# Patient Record
Sex: Female | Born: 1990 | Hispanic: Yes | Marital: Single | Smoking: Current every day smoker
Health system: Southern US, Community
[De-identification: ages and names within clinical notes are randomized; demographics above are authoritative.]

## PROBLEM LIST (undated history)

## (undated) HISTORY — PX: EYE SURGERY: SHX253

---

## 2015-07-29 LAB — HM HIV SCREENING LAB: HM HIV Screening: NEGATIVE

## 2017-12-21 ENCOUNTER — Ambulatory Visit: Payer: Self-pay | Admitting: Family Medicine

## 2017-12-21 NOTE — Progress Notes (Deleted)
   Subjective:    Patient ID: Stacy Zuniga, female    DOB: 01/01/1991, 27 y.o.   MRN: [id number]  HPI  Presents to clinic to establish with PCP  Review of Systems  Constitutional: Negative for chills, fatigue and fever.  HENT: Negative for congestion, ear pain, sinus pain and sore throat.   Eyes: Negative.   Respiratory: Negative for cough, shortness of breath and wheezing.   Cardiovascular: Negative for chest pain, palpitations and leg swelling.  Gastrointestinal: Negative for abdominal pain, diarrhea, nausea and vomiting.  Genitourinary: Negative for dysuria, frequency and urgency.  Musculoskeletal: Negative for arthralgias and myalgias.  Skin: Negative for color change, pallor and rash.  Neurological: Negative for syncope, light-headedness and headaches.  Psychiatric/Behavioral: The patient is not nervous/anxious.       Objective:   Physical Exam        Assessment & Plan:

## 2018-02-07 ENCOUNTER — Ambulatory Visit: Payer: Self-pay | Admitting: Physician Assistant

## 2018-03-14 LAB — HM PAP SMEAR: HM Pap smear: NEGATIVE

## 2018-03-22 ENCOUNTER — Ambulatory Visit: Payer: BC Managed Care – PPO | Admitting: Physician Assistant

## 2018-03-22 ENCOUNTER — Other Ambulatory Visit: Payer: Self-pay

## 2018-03-22 ENCOUNTER — Encounter: Payer: Self-pay | Admitting: Physician Assistant

## 2018-03-22 VITALS — BP 104/63 | HR 64 | Temp 98.4°F | Resp 16 | Wt 122.4 lb

## 2018-03-22 DIAGNOSIS — Z3041 Encounter for surveillance of contraceptive pills: Secondary | ICD-10-CM

## 2018-03-22 DIAGNOSIS — J45909 Unspecified asthma, uncomplicated: Secondary | ICD-10-CM | POA: Diagnosis not present

## 2018-03-22 DIAGNOSIS — Z114 Encounter for screening for human immunodeficiency virus [HIV]: Secondary | ICD-10-CM | POA: Diagnosis not present

## 2018-03-22 DIAGNOSIS — Z1329 Encounter for screening for other suspected endocrine disorder: Secondary | ICD-10-CM | POA: Diagnosis not present

## 2018-03-22 DIAGNOSIS — Z13220 Encounter for screening for lipoid disorders: Secondary | ICD-10-CM

## 2018-03-22 DIAGNOSIS — Z131 Encounter for screening for diabetes mellitus: Secondary | ICD-10-CM

## 2018-03-22 DIAGNOSIS — K121 Other forms of stomatitis: Secondary | ICD-10-CM

## 2018-03-22 DIAGNOSIS — Z130 Encounter for screening for diseases of the blood and blood-forming organs and certain disorders involving the immune mechanism: Secondary | ICD-10-CM

## 2018-03-22 MED ORDER — NORETHINDRONE ACET-ETHINYL EST 1-20 MG-MCG PO TABS: 1.0000 | ORAL_TABLET | Freq: Every day | ORAL | 3 refills | Status: DC

## 2018-03-22 MED ORDER — MAGIC MOUTHWASH W/LIDOCAINE: 5.0000 mL | Freq: Three times a day (TID) | ORAL | 0 refills | Status: DC

## 2018-03-22 MED ORDER — ALBUTEROL SULFATE HFA 108 (90 BASE) MCG/ACT IN AERS: 2.0000 | INHALATION_SPRAY | Freq: Four times a day (QID) | RESPIRATORY_TRACT | 2 refills | Status: DC | PRN

## 2018-03-22 NOTE — Patient Instructions (Signed)
Asthma, Adult    Asthma is a long-term (chronic) condition in which the airways get tight and narrow. The airways are the breathing passages that lead from the nose and mouth down into the lungs. A person with asthma will have times when symptoms get worse. These are called asthma attacks. They can cause coughing, whistling sounds when you breathe (wheezing), shortness of breath, and chest pain. They can make it hard to breathe. There is no cure for asthma, but medicines and lifestyle changes can help control it.  There are many things that can bring on an asthma attack or make asthma symptoms worse (triggers). Common triggers include:  · Mold.  · Dust.  · Cigarette smoke.  · Cockroaches.  · Things that can cause allergy symptoms (allergens). These include animal skin flakes (dander) and pollen from trees or grass.  · Things that pollute the air. These may include household cleaners, wood smoke, smog, or chemical odors.  · Cold air, weather changes, and wind.  · Crying or laughing hard.  · Stress.  · Certain medicines or drugs.  · Certain foods such as dried fruit, potato chips, and grape juice.  · Infections, such as a cold or the flu.  · Certain medical conditions or diseases.  · Exercise or tiring activities.  Asthma may be treated with medicines and by staying away from the things that cause asthma attacks. Types of medicines may include:  · Controller medicines. These help prevent asthma symptoms. They are usually taken every day.  · Fast-acting reliever or rescue medicines. These quickly relieve asthma symptoms. They are used as needed and provide short-term relief.  · Allergy medicines if your attacks are brought on by allergens.  · Medicines to help control the body's defense (immune) system.  Follow these instructions at home:  Avoiding triggers in your home  · Change your heating and air conditioning filter often.  · Limit your use of fireplaces and wood stoves.  · Get rid of pests (such as roaches and  mice) and their droppings.  · Throw away plants if you see mold on them.  · Clean your floors. Dust regularly. Use cleaning products that do not smell.  · Have someone vacuum when you are not home. Use a vacuum cleaner with a HEPA filter if possible.  · Replace carpet with wood, tile, or vinyl flooring. Carpet can trap animal skin flakes and dust.  · Use allergy-proof pillows, mattress covers, and box spring covers.  · Wash bed sheets and blankets every week in hot water. Dry them in a dryer.  · Keep your bedroom free of any triggers.   · Avoid pets and keep windows closed when things that cause allergy symptoms are in the air.  · Use blankets that are made of polyester or cotton.  · Clean bathrooms and kitchens with bleach. If possible, have someone repaint the walls in these rooms with mold-resistant paint. Keep out of the rooms that are being cleaned and painted.  · Wash your hands often with soap and water. If soap and water are not available, use hand sanitizer.  · Do not allow anyone to smoke in your home.  General instructions  · Take over-the-counter and prescription medicines only as told by your doctor.  ? Talk with your doctor if you have questions about how or when to take your medicines.  ? Make note if you need to use your medicines more often than usual.  · Do not use any products that   contain nicotine or tobacco, such as cigarettes and e-cigarettes. If you need help quitting, ask your doctor.  · Stay away from secondhand smoke.  · Avoid doing things outdoors when allergen counts are high and when air quality is low.  · Wear a ski mask when doing outdoor activities in the winter. The mask should cover your nose and mouth. Exercise indoors on cold days if you can.  · Warm up before you exercise. Take time to cool down after exercise.  · Use a peak flow meter as told by your doctor. A peak flow meter is a tool that measures how well the lungs are working.  · Keep track of the peak flow meter's readings.  Write them down.  · Follow your asthma action plan. This is a written plan for taking care of your asthma and treating your attacks.  · Make sure you get all the shots (vaccines) that your doctor recommends. Ask your doctor about a flu shot and a pneumonia shot.  · Keep all follow-up visits as told by your doctor. This is important.  Contact a doctor if:  · You have wheezing, shortness of breath, or a cough even while taking medicine to prevent attacks.  · The mucus you cough up (sputum) is thicker than usual.  · The mucus you cough up changes from clear or white to yellow, green, gray, or bloody.  · You have problems from the medicine you are taking, such as:  ? A rash.  ? Itching.  ? Swelling.  ? Trouble breathing.  · You need reliever medicines more than 2-3 times a week.  · Your peak flow reading is still at 50-79% of your personal best after following the action plan for 1 hour.  · You have a fever.  Get help right away if:  · You seem to be worse and are not responding to medicine during an asthma attack.  · You are short of breath even at rest.  · You get short of breath when doing very little activity.  · You have trouble eating, drinking, or talking.  · You have chest pain or tightness.  · You have a fast heartbeat.  · Your lips or fingernails start to turn blue.  · You are light-headed or dizzy, or you faint.  · Your peak flow is less than 50% of your personal best.  · You feel too tired to breathe normally.  Summary  · Asthma is a long-term (chronic) condition in which the airways get tight and narrow. An asthma attack can make it hard to breathe.  · Asthma cannot be cured, but medicines and lifestyle changes can help control it.  · Make sure you understand how to avoid triggers and how and when to use your medicines.  This information is not intended to replace advice given to you by your health care provider. Make sure you discuss any questions you have with your health care provider.  Document  Released: 06/08/2007 Document Revised: 01/25/2016 Document Reviewed: 01/25/2016  Elsevier Interactive Patient Education © 2019 Elsevier Inc.

## 2018-03-22 NOTE — Progress Notes (Signed)
Patient: Stacy Zuniga, Female    DOB: May 20, 1990, 28 y.o.   MRN: [id number] Visit Date: 03/22/2018  Today's Provider: Trey Sailors, PA-C   Chief Complaint  Patient presents with  . Establish Care   Subjective:     Establish Care: Stacy Zuniga is a 28 y.o. female who presents today to establish care.  Last saw [place] last November. Living in [place] with boyfriend, no children. Works at [employer]. Seen at [facility] clinic gynecology and PAP is up to date.   Patient reports an ulcer that has been on the lower inner portion of her lip since November. Says it has come and gone until November, it has lingered since that time. Report it is sensitive to spicy and citrus fruits. Reports she saw a dentist who didn't know what it was but thought it could be related to autoimmune disease.   She needs refills on her OCP, Junel 1/20. She denies history of blood clot, stroke, heart attack. She has quit smoking.   Last pap: 03/14/2018-Negative-in Care Everywhere Last Physical 03/09/2018 -----------------------------------------------------------------    Review of Systems  HENT: Positive for mouth sores, sinus pressure and tinnitus.   Gastrointestinal: Positive for constipation.  Allergic/Immunologic: Positive for environmental allergies.  Neurological: Positive for headaches.  All other systems reviewed and are negative.   Social History      She  reports that she has never smoked. She has never used smokeless tobacco. She reports current alcohol use. She reports current drug use. Drugs: Ketamine and Marijuana.       Social History   Socioeconomic History  . Marital status: Single    Spouse name: Not on file  . Number of children: Not on file  . Years of education: Not on file  . Highest education level: Not on file  Occupational History  . Not on file  Social Needs  . Financial resource strain: Not on file  . Food insecurity:    Worry: Not on file   Inability: Not on file  . Transportation needs:    Medical: Not on file    Non-medical: Not on file  Tobacco Use  . Smoking status: Never Smoker  . Smokeless tobacco: Never Used  Substance and Sexual Activity  . Alcohol use: Yes    Frequency: Never    Comment: 1 every 3 month  . Drug use: Yes    Types: Ketamine, Marijuana  . Sexual activity: Yes  Lifestyle  . Physical activity:    Days per week: Not on file    Minutes per session: Not on file  . Stress: Not on file  Relationships  . Social connections:    Talks on phone: Not on file    Gets together: Not on file    Attends religious service: Not on file    Active member of club or organization: Not on file    Attends meetings of clubs or organizations: Not on file    Relationship status: Not on file  Other Topics Concern  . Not on file  Social History Narrative  . Not on file    History reviewed. No pertinent past medical history.   There are no active problems to display for this patient.   History reviewed. No pertinent surgical history.  Family History        Family Status  Relation Name Status  . Mother  Alive  . Father  Alive        Her family  history is not on file.      No Known Allergies   Current Outpatient Medications:  .  ammonium lactate (LAC-HYDRIN) 12 % lotion, Apply topically., Disp: , Rfl:  .  fluticasone (FLONASE) 50 MCG/ACT nasal spray, Place into the nose., Disp: , Rfl:  .  omeprazole (PRILOSEC) 40 MG capsule, Take by mouth., Disp: , Rfl:  .  albuterol (PROVENTIL HFA;VENTOLIN HFA) 108 (90 Base) MCG/ACT inhaler, Inhale 2 puffs into the lungs every 6 (six) hours as needed for wheezing or shortness of breath., Disp: 1 Inhaler, Rfl: 2 .  magic mouthwash w/lidocaine SOLN, Take 5 mLs by mouth 3 (three) times daily., Disp: 120 mL, Rfl: 0 .  norethindrone-ethinyl estradiol (JUNEL 1/20) 1-20 MG-MCG tablet, Take 1 tablet by mouth daily., Disp: 3 Package, Rfl: 3   Patient Care Team: Maryella ShiversPollak,  Adriana M, PA-C as PCP - General (Physician Assistant)    Objective:    Vitals: BP 104/63 (BP Location: Left Arm, Patient Position: Sitting, Cuff Size: Normal)   Pulse 64   Temp 98.4 F (36.9 C) (Oral)   Resp 16   Wt 122 lb 6.4 oz (55.5 kg)   LMP 03/21/2018    Vitals:   03/22/18 1426  BP: 104/63  Pulse: 64  Resp: 16  Temp: 98.4 F (36.9 C)  TempSrc: Oral  Weight: 122 lb 6.4 oz (55.5 kg)     Physical Exam Constitutional:      Appearance: Normal appearance.  HENT:     Right Ear: Tympanic membrane and ear canal normal.     Left Ear: Tympanic membrane and ear canal normal.     Mouth/Throat:     Lips: Lesions present.   Cardiovascular:     Rate and Rhythm: Normal rate and regular rhythm.     Heart sounds: Normal heart sounds.  Pulmonary:     Effort: Pulmonary effort is normal.     Breath sounds: Normal breath sounds.  Skin:    General: Skin is warm and dry.  Neurological:     Mental Status: She is alert and oriented to person, place, and time. Mental status is at baseline.  Psychiatric:        Mood and Affect: Mood normal.        Behavior: Behavior normal.      Depression Screen PHQ 2/9 Scores 03/22/2018  PHQ - 2 Score 0       Assessment & Plan:     Routine Health Maintenance and Physical Exam  Exercise Activities and Dietary recommendations Goals   None      There is no immunization history on file for this patient.  Health Maintenance  Topic Date Due  . HIV Screening  06/02/2007  . TETANUS/TDAP  06/02/2011  . PAP-Cervical Cytology Screening  06/01/2013  . PAP SMEAR-Modifier  06/01/2013  . INFLUENZA VACCINE  08/03/2017     Discussed health benefits of physical activity, and encouraged her to engage in regular exercise appropriate for her age and condition.    1. Moderate asthma, unspecified whether complicated, unspecified whether persistent  - albuterol (PROVENTIL HFA;VENTOLIN HFA) 108 (90 Base) MCG/ACT inhaler; Inhale 2 puffs into the  lungs every 6 (six) hours as needed for wheezing or shortness of breath.  Dispense: 1 Inhaler; Refill: 2  2. Encounter for screening for HIV  - HIV antibody (with reflex)  3. Thyroid disorder screening  - TSH  4. Lipid screening  - Lipid Profile  5. Screening for deficiency anemia  - CBC with  Differential  6. Diabetes mellitus screening  - Comprehensive Metabolic Panel (CMET)  7. Mouth ulcer  Have given mouth wash as below. Because lesion is persistent, will have her see ENT.   - magic mouthwash w/lidocaine SOLN; Take 5 mLs by mouth 3 (three) times daily.  Dispense: 120 mL; Refill: 0 - Ambulatory referral to ENT  8. Encounter for surveillance of contraceptive pills  - norethindrone-ethinyl estradiol (JUNEL 1/20) 1-20 MG-MCG tablet; Take 1 tablet by mouth daily.  Dispense: 3 Package; Refill: 3  The entirety of the information documented in the History of Present Illness, Review of Systems and Physical Exam were personally obtained by me. Portions of this information were initially documented by Hetty Ely, CMA and reviewed by me for thoroughness and accuracy.      --------------------------------------------------------------------    Trey Sailors, PA-C  [facility]

## 2018-03-23 LAB — CBC WITH DIFFERENTIAL/PLATELET
Basophils Absolute: 0.1 10*3/uL (ref 0.0–0.2)
Basos: 1 %
EOS (ABSOLUTE): 0.1 10*3/uL (ref 0.0–0.4)
Eos: 1 %
Hematocrit: 40.2 % (ref 34.0–46.6)
Hemoglobin: 13.1 g/dL (ref 11.1–15.9)
Immature Grans (Abs): 0 10*3/uL (ref 0.0–0.1)
Immature Granulocytes: 0 %
Lymphocytes Absolute: 3.1 10*3/uL (ref 0.7–3.1)
Lymphs: 38 %
MCH: 29.6 pg (ref 26.6–33.0)
MCHC: 32.6 g/dL (ref 31.5–35.7)
MCV: 91 fL (ref 79–97)
Monocytes Absolute: 0.6 10*3/uL (ref 0.1–0.9)
Monocytes: 7 %
Neutrophils Absolute: 4.2 10*3/uL (ref 1.4–7.0)
Neutrophils: 53 %
Platelets: 343 10*3/uL (ref 150–450)
RBC: 4.42 x10E6/uL (ref 3.77–5.28)
RDW: 13.6 % (ref 11.7–15.4)
WBC: 8.1 10*3/uL (ref 3.4–10.8)

## 2018-03-23 LAB — LIPID PANEL
Chol/HDL Ratio: 3.6 ratio (ref 0.0–4.4)
Cholesterol, Total: 172 mg/dL (ref 100–199)
HDL: 48 mg/dL (ref >39)
LDL Calculated: 98 mg/dL (ref 0–99)
Triglycerides: 129 mg/dL (ref 0–149)
VLDL Cholesterol Cal: 26 mg/dL (ref 5–40)

## 2018-03-23 LAB — COMPREHENSIVE METABOLIC PANEL
ALT: 50 IU/L — ABNORMAL HIGH (ref 0–32)
AST: 39 IU/L (ref 0–40)
Albumin/Globulin Ratio: 1.6 (ref 1.2–2.2)
Albumin: 4.5 g/dL (ref 3.9–5.0)
Alkaline Phosphatase: 63 IU/L (ref 39–117)
BUN/Creatinine Ratio: 16 (ref 9–23)
BUN: 9 mg/dL (ref 6–20)
Bilirubin Total: 0.4 mg/dL (ref 0.0–1.2)
CO2: 22 mmol/L (ref 20–29)
Calcium: 9 mg/dL (ref 8.7–10.2)
Chloride: 102 mmol/L (ref 96–106)
Creatinine, Ser: 0.55 mg/dL — ABNORMAL LOW (ref 0.57–1.00)
GFR calc Af Amer: 151 mL/min/{1.73_m2} (ref >59)
GFR calc non Af Amer: 131 mL/min/{1.73_m2} (ref >59)
Globulin, Total: 2.9 g/dL (ref 1.5–4.5)
Glucose: 73 mg/dL (ref 65–99)
Potassium: 3.4 mmol/L — ABNORMAL LOW (ref 3.5–5.2)
Sodium: 142 mmol/L (ref 134–144)
Total Protein: 7.4 g/dL (ref 6.0–8.5)

## 2018-03-23 LAB — TSH: TSH: 0.673 u[IU]/mL (ref 0.450–4.500)

## 2018-03-23 LAB — HIV ANTIBODY (ROUTINE TESTING W REFLEX): HIV Screen 4th Generation wRfx: NONREACTIVE

## 2018-04-02 ENCOUNTER — Ambulatory Visit: Payer: BC Managed Care – PPO | Admitting: Physician Assistant

## 2018-04-02 ENCOUNTER — Encounter: Payer: Self-pay | Admitting: Physician Assistant

## 2018-04-02 ENCOUNTER — Other Ambulatory Visit: Payer: Self-pay

## 2018-04-02 VITALS — BP 116/79 | HR 96 | Temp 98.5°F | Wt 123.6 lb

## 2018-04-02 DIAGNOSIS — R2231 Localized swelling, mass and lump, right upper limb: Secondary | ICD-10-CM

## 2018-04-02 NOTE — Patient Instructions (Signed)
Lymphadenopathy    Lymphadenopathy means that your lymph glands are swollen or larger than normal (enlarged). Lymph glands, also called lymph nodes, are collections of tissue that filter bacteria, viruses, and waste from your bloodstream. They are part of your body's disease-fighting system (immune system), which protects your body from germs.  There may be different causes of lymphadenopathy, depending on where it is in your body. Some types go away on their own. Lymphadenopathy can occur anywhere that you have lymph glands, including these areas:  · Neck (cervical lymphadenopathy).  · Chest (mediastinal lymphadenopathy).  · Lungs (hilar lymphadenopathy).  · Underarms (axillary lymphadenopathy).  · Groin (inguinal lymphadenopathy).  When your immune system responds to germs, infection-fighting cells and fluid build up in your lymph glands. This causes some swelling and enlargement. If the lymph glands do not go back to normal after you have an infection or disease, your health care provider may do tests. These tests help to monitor your condition and find the reason why the glands are still swollen and enlarged.  Follow these instructions at home:  · Get plenty of rest.  · Take over-the-counter and prescription medicines only as told by your health care provider. Your health care provider may recommend over-the-counter medicines for pain.  · If directed, apply heat to swollen lymph glands as often as told by your health care provider. Use the heat source that your health care provider recommends, such as a moist heat pack or a heating pad.  ? Place a towel between your skin and the heat source.  ? Leave the heat on for 20-30 minutes.  ? Remove the heat if your skin turns bright red. This is especially important if you are unable to feel pain, heat, or cold. You may have a greater risk of getting burned.  · Check your affected lymph glands every day for changes. Check other lymph gland areas as told by your health  care provider. Check for changes such as:  ? More swelling.  ? Sudden increase in size.  ? Redness or pain.  ? Hardness.  · Keep all follow-up visits as told by your health care provider. This is important.  Contact a health care provider if you have:  · Swelling that gets worse or spreads to other areas.  · Problems with breathing.  · Lymph glands that:  ? Are still swollen after 2 weeks.  ? Have suddenly gotten bigger.  ? Are red, painful, or hard.  · A fever or chills.  · Fatigue.  · A sore throat.  · Pain in your abdomen.  · Weight loss.  · Night sweats.  Get help right away if you have:  · Fluid leaking from an enlarged lymph gland.  · Severe pain.  · Chest pain.  · Shortness of breath.  Summary  · Lymphadenopathy means that your lymph glands are swollen or larger than normal (enlarged).  · Lymph glands (also called lymph nodes) are collections of tissue that filter bacteria, viruses, and waste from the bloodstream. They are part of your body's disease-fighting system (immune system).  · Lymphadenopathy can occur anywhere that you have lymph glands.  · If your enlarged and swollen lymph glands do not go back to normal after you have an infection or disease, your health care provider may do tests to monitor your condition and find the reason why the glands are still swollen and enlarged.  · Check your affected lymph glands every day for changes. Check other lymph   gland areas as told by your health care provider.  This information is not intended to replace advice given to you by your health care provider. Make sure you discuss any questions you have with your health care provider.  Document Released: 09/29/2007 Document Revised: 11/04/2016 Document Reviewed: 11/04/2016  Elsevier Interactive Patient Education © 2019 Elsevier Inc.

## 2018-04-02 NOTE — Progress Notes (Signed)
Patient: Stacy Zuniga Female    DOB: Jun 04, 1990   28 y.o.   MRN: [id number] Visit Date: 04/02/2018  Today's Provider: Trey Sailors, PA-C   Chief Complaint  Patient presents with  . Skin Problem   Subjective:    HPI  Skin Problem Patient presents today for a bump under right arm for about 3 weeks now. Patient states that the area is red and painful, but not warm to touch. Patient hasve not applied anything to the area. Patient states hse is taking Tylenol which helps with pain but pain returns after Tylenol wears off. She denies fevers, chills, nausea, vomiting, unintended weight loss. She denies breast notable breast mass. She denies rashes or wound on her chest or arms. She denies drainage from the mass.   Wt Readings from Last 3 Encounters:  04/02/18 123 lb 9.6 oz (56.1 kg)  03/22/18 122 lb 6.4 oz (55.5 kg)     No Known Allergies   Current Outpatient Medications:  .  albuterol (PROVENTIL HFA;VENTOLIN HFA) 108 (90 Base) MCG/ACT inhaler, Inhale 2 puffs into the lungs every 6 (six) hours as needed for wheezing or shortness of breath., Disp: 1 Inhaler, Rfl: 2 .  ammonium lactate (LAC-HYDRIN) 12 % lotion, Apply topically., Disp: , Rfl:  .  fluticasone (FLONASE) 50 MCG/ACT nasal spray, Place into the nose., Disp: , Rfl:  .  magic mouthwash w/lidocaine SOLN, Take 5 mLs by mouth 3 (three) times daily., Disp: 120 mL, Rfl: 0 .  norethindrone-ethinyl estradiol (JUNEL 1/20) 1-20 MG-MCG tablet, Take 1 tablet by mouth daily., Disp: 3 Package, Rfl: 3 .  omeprazole (PRILOSEC) 40 MG capsule, Take by mouth., Disp: , Rfl:   Review of Systems  HENT: Negative.   Respiratory: Negative.   Genitourinary: Negative.   Neurological: Negative.     Social History   Tobacco Use  . Smoking status: Never Smoker  . Smokeless tobacco: Never Used  Substance Use Topics  . Alcohol use: Yes    Frequency: Never    Comment: 1 every 3 month      Objective:   BP 116/79 (BP Location: Left  Arm, Patient Position: Sitting, Cuff Size: Normal)   Pulse 96   Temp 98.5 F (36.9 C) (Oral)   Wt 123 lb 9.6 oz (56.1 kg)   LMP 03/21/2018  Vitals:   04/02/18 1451  BP: 116/79  Pulse: 96  Temp: 98.5 F (36.9 C)  TempSrc: Oral  Weight: 123 lb 9.6 oz (56.1 kg)     Physical Exam Constitutional:      Appearance: Normal appearance.  Neck:     Musculoskeletal: No muscular tenderness.  Chest:     Breasts:        Right: Normal. No mass.        Left: Normal. No mass.    Lymphadenopathy:     Cervical: No cervical adenopathy.  Skin:    General: Skin is warm and dry.     Findings: No erythema or rash.  Neurological:     Mental Status: She is alert and oriented to person, place, and time. Mental status is at baseline.         Assessment & Plan    1. Arm mass, right  Palpable tender mass in right axilla, possibly lymphadenopathy vs. Cyst. No noted breast mass on exam, CBC and CMET from 03/22/2018 were normal, HIV negative. Counseled patient on various causes including infection, rash, breast mass, cyst etc. Patient would be comforted  by ultrasound to better characterize, will proceed as below. Advised patient to take 600 mg ibuprofen TID with food for 1-2 weeks to see if there is any improvement.   - [place] AXILLA RIGHT; Future  The entirety of the information documented in the History of Present Illness, Review of Systems and Physical Exam were personally obtained by me. Portions of this information were initially documented by [facility] and reviewed by me for thoroughness and accuracy.         Trey Sailors, PA-C  [facility]

## 2018-04-05 ENCOUNTER — Telehealth: Payer: Self-pay | Admitting: Physician Assistant

## 2018-04-05 DIAGNOSIS — R2231 Localized swelling, mass and lump, right upper limb: Secondary | ICD-10-CM

## 2018-04-05 NOTE — Telephone Encounter (Signed)
Per [facility] order for ultrasound will need to be changed to R breast ultrasound to include axilla [id number]

## 2018-04-05 NOTE — Telephone Encounter (Signed)
Please review

## 2018-04-05 NOTE — Telephone Encounter (Signed)
Ordered new ultrasound.

## 2018-04-11 ENCOUNTER — Inpatient Hospital Stay: Admission: RE | Admit: 2018-04-11 | Payer: Self-pay | Source: Ambulatory Visit

## 2018-04-30 ENCOUNTER — Ambulatory Visit
Admission: RE | Admit: 2018-04-30 | Discharge: 2018-04-30 | Disposition: A | Discharge status: Home / Self Care | Payer: BC Managed Care – PPO | Source: Ambulatory Visit | Attending: Physician Assistant | Admitting: Physician Assistant

## 2018-04-30 ENCOUNTER — Other Ambulatory Visit: Payer: Self-pay

## 2018-04-30 DIAGNOSIS — R2231 Localized swelling, mass and lump, right upper limb: Secondary | ICD-10-CM

## 2018-05-01 ENCOUNTER — Telehealth: Payer: Self-pay

## 2018-05-01 NOTE — Telephone Encounter (Signed)
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 This shouldn't cause issues. Recommended follow up ultrasound in 6, 12, and 24 months.

## 2018-05-01 NOTE — Telephone Encounter (Signed)
Patient was advised.  

## 2018-08-14 ENCOUNTER — Ambulatory Visit: Payer: BC Managed Care – PPO | Admitting: Physician Assistant

## 2018-08-17 ENCOUNTER — Ambulatory Visit: Payer: BC Managed Care – PPO | Admitting: Physician Assistant

## 2018-08-24 ENCOUNTER — Emergency Department: Payer: Self-pay

## 2018-08-24 ENCOUNTER — Emergency Department
Admission: EM | Admit: 2018-08-24 | Discharge: 2018-08-25 | Disposition: A | Discharge status: Home / Self Care | Payer: Self-pay | Attending: Emergency Medicine | Admitting: Emergency Medicine

## 2018-08-24 ENCOUNTER — Other Ambulatory Visit: Payer: Self-pay

## 2018-08-24 DIAGNOSIS — Z79899 Other long term (current) drug therapy: Secondary | ICD-10-CM | POA: Insufficient documentation

## 2018-08-24 DIAGNOSIS — R079 Chest pain, unspecified: Secondary | ICD-10-CM

## 2018-08-24 DIAGNOSIS — R109 Unspecified abdominal pain: Secondary | ICD-10-CM | POA: Insufficient documentation

## 2018-08-24 DIAGNOSIS — R0789 Other chest pain: Secondary | ICD-10-CM | POA: Insufficient documentation

## 2018-08-24 DIAGNOSIS — R05 Cough: Secondary | ICD-10-CM | POA: Insufficient documentation

## 2018-08-24 DIAGNOSIS — F17200 Nicotine dependence, unspecified, uncomplicated: Secondary | ICD-10-CM | POA: Insufficient documentation

## 2018-08-24 LAB — CBC
HCT: 42.2 % (ref 36.0–46.0)
Hemoglobin: 14.3 g/dL (ref 12.0–15.0)
MCH: 29.5 pg (ref 26.0–34.0)
MCHC: 33.9 g/dL (ref 30.0–36.0)
MCV: 87.2 fL (ref 80.0–100.0)
Platelets: 299 10*3/uL (ref 150–400)
RBC: 4.84 MIL/uL (ref 3.87–5.11)
RDW: 12.9 % (ref 11.5–15.5)
WBC: 9.7 10*3/uL (ref 4.0–10.5)
nRBC: 0 % (ref 0.0–0.2)

## 2018-08-24 LAB — HEPATIC FUNCTION PANEL
ALT: 32 U/L (ref 0–44)
AST: 28 U/L (ref 15–41)
Albumin: 4.4 g/dL (ref 3.5–5.0)
Alkaline Phosphatase: 53 U/L (ref 38–126)
Bilirubin, Direct: 0.1 mg/dL (ref 0.0–0.2)
Indirect Bilirubin: 0.6 mg/dL (ref 0.3–0.9)
Total Bilirubin: 0.7 mg/dL (ref 0.3–1.2)
Total Protein: 7.7 g/dL (ref 6.5–8.1)

## 2018-08-24 LAB — BASIC METABOLIC PANEL
Anion gap: 11 (ref 5–15)
BUN: 9 mg/dL (ref 6–20)
CO2: 24 mmol/L (ref 22–32)
Calcium: 9.2 mg/dL (ref 8.9–10.3)
Chloride: 103 mmol/L (ref 98–111)
Creatinine, Ser: 0.44 mg/dL (ref 0.44–1.00)
GFR calc Af Amer: >60 mL/min (ref >60)
GFR calc non Af Amer: >60 mL/min (ref >60)
Glucose, Bld: 99 mg/dL (ref 70–99)
Potassium: 3 mmol/L — ABNORMAL LOW (ref 3.5–5.1)
Sodium: 138 mmol/L (ref 135–145)

## 2018-08-24 LAB — TROPONIN I (HIGH SENSITIVITY)
Troponin I (High Sensitivity): <2 ng/L (ref <18)
Troponin I (High Sensitivity): <2 ng/L (ref <18)

## 2018-08-24 LAB — POCT PREGNANCY, URINE: Preg Test, Ur: NEGATIVE

## 2018-08-24 LAB — LIPASE, BLOOD: Lipase: 28 U/L (ref 11–51)

## 2018-08-24 MED ORDER — IOHEXOL 350 MG/ML SOLN
75.0000 mL | Freq: Once | INTRAVENOUS | Status: AC | PRN
  Administered 2018-08-24: 75 mL via INTRAVENOUS

## 2018-08-24 MED ORDER — IOHEXOL 240 MG/ML SOLN
50.0000 mL | Freq: Once | INTRAMUSCULAR | Status: AC
  Administered 2018-08-24: 50 mL via ORAL

## 2018-08-24 NOTE — ED Notes (Signed)
Report given to Silvia, RN

## 2018-08-24 NOTE — ED Notes (Signed)
Pt laying in bed speaking with this RN in NAD, a&ox4. PT reports burping relieves her chest pain for a few mins but then it returns.

## 2018-08-24 NOTE — ED Notes (Signed)
Pt ambulatory to bedside commode no distress noted

## 2018-08-24 NOTE — ED Provider Notes (Signed)
[facility] Emergency Department Provider Note   ____________________________________________   First MD Initiated Contact with Patient 08/24/18 2138     (approximate)  I have reviewed the triage vital signs and the nursing notes.   HISTORY  Chief Complaint Chest Pain    HPI Stacy Zuniga is a 28 y.o. female who comes in complaining of pain in the left upper chest and upper chest radiating toward the right shoulder.  This been going on for about 3 days.  Before was intermittent is constant today.  She has a bit of a cough that is not too unusual for her because she has some allergies.  The pain in the chest can be reproduced with palpation but not in the shoulder.  Patient also reports left mid abdominal pain for 3 weeks.  Seems to be worse with food at times.  No nausea vomiting or diarrhea.  No fever.  Cough is nonproductive.  Again the cough is not a particularly new symptom.  Abdominal pain is not currently present was present earlier.  It is not severe discounted deep and achy.  Thing seems to make it better or worse particularly.         History reviewed. No pertinent past medical history.  There are no active problems to display for this patient.   History reviewed. No pertinent surgical history.  Prior to Admission medications   Medication Sig Start Date End Date Taking? Authorizing Provider  albuterol (PROVENTIL HFA;VENTOLIN HFA) 108 (90 Base) MCG/ACT inhaler Inhale 2 puffs into the lungs every 6 (six) hours as needed for wheezing or shortness of breath. 03/22/18   Trinna Post, PA-C  ammonium lactate (LAC-HYDRIN) 12 % lotion Apply topically.    [provider]  fluticasone (FLONASE) 50 MCG/ACT nasal spray Place into the nose. 02/09/16   [provider]  magic mouthwash w/lidocaine SOLN Take 5 mLs by mouth 3 (three) times daily. 03/22/18   Trinna Post, PA-C  norethindrone-ethinyl estradiol (JUNEL 1/20) 1-20 MG-MCG  tablet Take 1 tablet by mouth daily. 03/22/18   Trinna Post, PA-C  omeprazole (PRILOSEC) 40 MG capsule Take by mouth. 02/09/16   [provider]    Allergies Patient has no known allergies.  History reviewed. No pertinent family history.  Social History Social History   Tobacco Use   Smoking status: Current Every Day Smoker   Smokeless tobacco: Never Used  Substance Use Topics   Alcohol use: Yes    Frequency: Never    Comment: 1 every 3 month   Drug use: Yes    Types: Ketamine, Marijuana    Review of Systems  Constitutional: No fever/chills Eyes: No visual changes. ENT: No sore throat. Cardiovascular:  chest pain. Respiratory: Denies shortness of breath. Gastrointestinal:  abdominal pain.  No nausea, no vomiting.  No diarrhea.  No constipation. Genitourinary: Negative for dysuria. Musculoskeletal: Negative for back pain. Skin: Negative for rash. Neurological: Negative for headaches, focal weakness   ____________________________________________   PHYSICAL EXAM:  VITAL SIGNS: ED Triage Vitals  Enc Vitals Group     BP 08/24/18 1801 127/77     Pulse Rate 08/24/18 1801 95     Resp 08/24/18 1801 18     Temp 08/24/18 1801 98.7 F (37.1 C)     Temp Source 08/24/18 1801 Oral     SpO2 08/24/18 1801 100 %     Weight 08/24/18 1802 124 lb (56.2 kg)     Height 08/24/18 1802 4\' 11"  (  1.499 m)     Head Circumference --      Peak Flow --      Pain Score 08/24/18 1802 4     Pain Loc --      Pain Edu? --      Excl. in GC? --     Constitutional: Alert and oriented. Well appearing and in no acute distress. Eyes: Conjunctivae are normal. Head: Atraumatic. Nose: No congestion/rhinnorhea. Mouth/Throat: Mucous membranes are moist.  Oropharynx non-erythematous. Neck: No stridor.   Cardiovascular: Normal rate, regular rhythm. Grossly normal heart sounds.  Good peripheral circulation. Respiratory: Normal respiratory effort.  No retractions. Lungs  CTAB. Gastrointestinal: Soft and nontender. No distention. No abdominal bruits. No CVA tenderness. Musculoskeletal: No lower extremity tenderness nor edema.  Neurologic:  Normal speech and language. No gross focal neurologic deficits are appreciated.  Skin:  Skin is warm, dry and intact. No rash noted.  ____________________________________________   LABS (all labs ordered are listed, but only abnormal results are displayed)  Labs Reviewed  BASIC METABOLIC PANEL - Abnormal; Notable for the following components:      Result Value   Potassium 3.0 (*)    All other components within normal limits  CBC  LIPASE, BLOOD  HEPATIC FUNCTION PANEL  POC URINE PREG, ED  POCT PREGNANCY, URINE  TROPONIN I (HIGH SENSITIVITY)  TROPONIN I (HIGH SENSITIVITY)   ____________________________________________  EKG  EKG read interpreted by me shows sinus tachycardia rate of 110 normal axis slight ST segment depression only in AV half. ____________________________________________  RADIOLOGY  ED MD interpretation: Dust x-ray read by radiology as a pneumatocele or emphysematous change in the right midlung.  Recommend a nonacute CT scan.  Because of the patient's 3 weeks of abdominal pain on the chest pain today I will go ahead and CT her now.  Official radiology report(s): Dg Chest 2 View  Result Date: 08/24/2018 CLINICAL DATA:  Chest pain, cough EXAM: CHEST - 2 VIEW COMPARISON:  None. FINDINGS: The heart size and mediastinal contours are within normal limits. No acute airspace abnormality. There appears to be a pneumatocele or emphysematous change of the right midlung. The visualized skeletal structures are unremarkable. IMPRESSION: 1.  No acute abnormality of the lungs. 2. 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 This may be further evaluated by a nonacute CT examination of the chest in the setting of ongoing cough.  Electronically Signed   By: Lauralyn PrimesAlex  Bibbey M.D.   On: 08/24/2018 18:58    ____________________________________________   PROCEDURES  Procedure(s) performed (including Critical Care):  Procedures   ____________________________________________   INITIAL IMPRESSION / ASSESSMENT AND PLAN / ED COURSE Lujuana Brymer was evaluated in Emergency Department on 08/24/2018 for the symptoms described in the history of present illness. She was evaluated in the context of the global COVID-19 pandemic, which necessitated consideration that the patient might be at risk for infection with the SARS-CoV-2 virus that causes COVID-19. Institutional protocols and algorithms that pertain to the evaluation of patients at risk for COVID-19 are in a state of rapid change based on information released by regulatory bodies including the CDC and federal and state organizations. These policies and algorithms were followed during the patient's care in the ED.      CT scans pending signed out the oncoming physician.       ____________________________________________   FINAL CLINICAL IMPRESSION(S) / ED DIAGNOSES  Final diagnoses:  Nonspecific chest pain  Abdominal pain, unspecified abdominal location     ED Discharge Orders    None       Note:  This document was prepared using Dragon voice recognition software and may include unintentional dictation errors.    Arnaldo NatalMalinda, Terryann Verbeek F, MD 08/24/18 [phone]

## 2018-08-24 NOTE — ED Triage Notes (Signed)
Pt arrives via ems from home with c/o CP starting 3 days ago. First two days pain intermittent with constant pain today. Pt reports cough but states she has allergies that commonly cause cough. Pt reports chest pain in center chest that can be recreated with palpation, but also reports pain upper right back. NAD noted at this time. Pt denies any N/V/D or fevers

## 2018-08-25 MED ORDER — DICYCLOMINE HCL 20 MG PO TABS: 20.0000 mg | ORAL_TABLET | Freq: Three times a day (TID) | ORAL | 0 refills | Status: DC | PRN

## 2018-08-25 NOTE — ED Provider Notes (Signed)
-----------------------------------------   12:34 AM on 08/25/2018 -----------------------------------------  Patient's work-up is largely benign.  CT scan negative for acute abnormality.  Does show a borderline enlarged axillary lymph node.  Patient states she had already been evaluated for this previously.  Does state intermittent abdominal cramping for the past 3 weeks, states at one point she was diagnosed with IBS but a second doctor told her probably was not IBS so she is not sure.  With a negative work-up including negative CT I believe the patient safe for discharge home with PCP follow-up.  We will discharge with a short course of Bentyl to see if this aids in the patient's symptoms.  Patient agreeable to plan of care.   Harvest Dark, MD 08/25/18 [phone]

## 2018-08-25 NOTE — ED Notes (Signed)
Pt verbalizes understanding of discharge instructions.

## 2018-09-04 ENCOUNTER — Encounter: Payer: Self-pay | Admitting: Physician Assistant

## 2018-09-04 ENCOUNTER — Other Ambulatory Visit: Payer: Self-pay

## 2018-09-04 ENCOUNTER — Ambulatory Visit (INDEPENDENT_AMBULATORY_CARE_PROVIDER_SITE_OTHER): Payer: Self-pay | Admitting: Physician Assistant

## 2018-09-04 VITALS — BP 118/76 | HR 90 | Temp 97.8°F | Resp 16 | Wt 120.0 lb

## 2018-09-04 DIAGNOSIS — J029 Acute pharyngitis, unspecified: Secondary | ICD-10-CM

## 2018-09-04 DIAGNOSIS — R109 Unspecified abdominal pain: Secondary | ICD-10-CM

## 2018-09-04 DIAGNOSIS — J351 Hypertrophy of tonsils: Secondary | ICD-10-CM

## 2018-09-04 MED ORDER — DICYCLOMINE HCL 20 MG PO TABS: 20.0000 mg | ORAL_TABLET | Freq: Three times a day (TID) | ORAL | 2 refills | Status: DC

## 2018-09-04 NOTE — Patient Instructions (Signed)
Abdominal Pain, Adult    Many things can cause belly (abdominal) pain. Most times, belly pain is not dangerous. Many cases of belly pain can be watched and treated at home. Sometimes belly pain is serious, though. Your doctor will try to find the cause of your belly pain.  Follow these instructions at home:  · Take over-the-counter and prescription medicines only as told by your doctor. Do not take medicines that help you poop (laxatives) unless told to by your doctor.  · Drink enough fluid to keep your pee (urine) clear or pale yellow.  · Watch your belly pain for any changes.  · Keep all follow-up visits as told by your doctor. This is important.  Contact a doctor if:  · Your belly pain changes or gets worse.  · You are not hungry, or you lose weight without trying.  · You are having trouble pooping (constipated) or have watery poop (diarrhea) for more than 2-3 days.  · You have pain when you pee or poop.  · Your belly pain wakes you up at night.  · Your pain gets worse with meals, after eating, or with certain foods.  · You are throwing up and cannot keep anything down.  · You have a fever.  Get help right away if:  · Your pain does not go away as soon as your doctor says it should.  · You cannot stop throwing up.  · Your pain is only in areas of your belly, such as the right side or the left lower part of the belly.  · You have bloody or black poop, or poop that looks like tar.  · You have very bad pain, cramping, or bloating in your belly.  · You have signs of not having enough fluid or water in your body (dehydration), such as:  ? Dark pee, very little pee, or no pee.  ? Cracked lips.  ? Dry mouth.  ? Sunken eyes.  ? Sleepiness.  ? Weakness.  This information is not intended to replace advice given to you by your health care provider. Make sure you discuss any questions you have with your health care provider.  Document Released: 06/08/2007 Document Revised: 07/10/2015 Document Reviewed: 06/03/2015  Elsevier  Interactive Patient Education © 2020 [employer] Inc[employer]

## 2018-09-04 NOTE — Progress Notes (Signed)
Patient: Stacy Zuniga Female    DOB: 06-17-1990   28 y.o.   MRN: [id number] Visit Date: 09/04/2018  Today's Provider: Trey SailorsAdriana M Ange Puskas, PA-C   Chief Complaint  Patient presents with  . Follow-up   Subjective:     HPI  Follow up ER visit  Patient was seen in ER for chest pain on 08/24/2018. She was treated for GERD. Treatment for this included labs, x-ray and ct scan. Started on Bentyl She reports excellent compliance with treatment. She reports this condition is Improved.  Abdominal pain in LLQ that comes and goes. Would hurt if she laid down and hunched together. Reports nothing made it better. Nothing made it better completely, sometimes certain positions could make it better somewhat. She feels if she is more active, then it is better. If she is less active it seems more noticeable.  Has taken tylenol, unsure if it helped. No diarrhea, constipation. No heavy periods and not a lot of cramping. CT abdomen and pelvis did not show any infection, ovarian cysts. She feels the Bentyl has helped somewhat though she has been taking this sparingly due to receiving limited supply from ER. She was having some constipation initially before this episode that resolved with stool softeners.   Sore scratchy throat. This has started in the past week and gotten somewhat better. She does have post nasal drip. She has a history of large tonsils and recurrent sore throats, especially in the winter time. She would like to get her tonsils removed.   Not taking birth control. She is not currently sexually active. She does not desire children at this point. She knows that if she is sexually active she should use condoms and if she chooses to restart birth control, she should use condoms as back up protection for one month. ------------------------------------------------------------------------------------   No Known Allergies   Current Outpatient Medications:  .  albuterol (PROVENTIL  HFA;VENTOLIN HFA) 108 (90 Base) MCG/ACT inhaler, Inhale 2 puffs into the lungs every 6 (six) hours as needed for wheezing or shortness of breath., Disp: 1 Inhaler, Rfl: 2 .  ammonium lactate (LAC-HYDRIN) 12 % lotion, Apply topically., Disp: , Rfl:  .  dicyclomine (BENTYL) 20 MG tablet, Take 1 tablet (20 mg total) by mouth 3 (three) times daily as needed for spasms., Disp: 30 tablet, Rfl: 0 .  fluticasone (FLONASE) 50 MCG/ACT nasal spray, Place into the nose., Disp: , Rfl:  .  magic mouthwash w/lidocaine SOLN, Take 5 mLs by mouth 3 (three) times daily., Disp: 120 mL, Rfl: 0 .  omeprazole (PRILOSEC) 40 MG capsule, Take by mouth., Disp: , Rfl:  .  norethindrone-ethinyl estradiol (JUNEL 1/20) 1-20 MG-MCG tablet, Take 1 tablet by mouth daily. (Patient not taking: Reported on 09/04/2018), Disp: 3 Package, Rfl: 3  Review of Systems  Constitutional: Negative.   Gastrointestinal: Positive for abdominal distention, abdominal pain and nausea. Negative for diarrhea and vomiting.    Social History   Tobacco Use  . Smoking status: Former Smoker    Types: Cigarettes    Start date: 08/30/2018  . Smokeless tobacco: Never Used  Substance Use Topics  . Alcohol use: Yes    Frequency: Never    Comment: 1 every 3 month      Objective:   BP 118/76 (BP Location: Left Arm, Patient Position: Sitting, Cuff Size: Normal)   Pulse 90   Temp 97.8 F (36.6 C) (Temporal)   Resp 16   Wt 120 lb (54.4 kg)  BMI 24.24 kg/m  Vitals:   09/04/18 1327  BP: 118/76  Pulse: 90  Resp: 16  Temp: 97.8 F (36.6 C)  TempSrc: Temporal  Weight: 120 lb (54.4 kg)  Body mass index is 24.24 kg/m.   Physical Exam HENT:     Mouth/Throat:     Mouth: Mucous membranes are moist.     Pharynx: Oropharynx is clear. No oropharyngeal exudate or posterior oropharyngeal erythema.     Tonsils: No tonsillar exudate.     Comments: Tonsils enlarged bilaterally.  Cardiovascular:     Heart sounds: Normal heart sounds.  Pulmonary:      Effort: Pulmonary effort is normal.     Breath sounds: Normal breath sounds.  Abdominal:     General: Abdomen is flat. Bowel sounds are normal.     Palpations: Abdomen is soft.  Lymphadenopathy:     Cervical: No cervical adenopathy.  Skin:    General: Skin is warm and dry.  Neurological:     Mental Status: She is oriented to person, place, and time. Mental status is at baseline.  Psychiatric:        Mood and Affect: Mood normal.        Behavior: Behavior normal.      No results found for any visits on 09/04/18.     Assessment & Plan    1. Abdominal pain, unspecified abdominal location  Unclear etiology though infections and masses have been r/o with imaging and labwork in the ER. She has been told she possibly had IBS in the past and does have abdominal pain that is relieved by bowel movements and change in bowel consistency. Recommend she continue bentyl and miralax PRN to avoid constipation.   - dicyclomine (BENTYL) 20 MG tablet; Take 1 tablet (20 mg total) by mouth 4 (four) times daily -  before meals and at bedtime.  Dispense: 120 tablet; Refill: 2  2. Enlarged tonsils  - Ambulatory referral to ENT  3. Sore throat  Consistent with allergies. May take daily 2nd gen antihistamine of her choice.   - Ambulatory referral to ENT      Trinna Post, PA-C  [facility]

## 2018-09-24 ENCOUNTER — Other Ambulatory Visit: Payer: Self-pay

## 2018-09-24 ENCOUNTER — Encounter: Payer: Self-pay | Admitting: Physician Assistant

## 2018-09-24 ENCOUNTER — Telehealth (INDEPENDENT_AMBULATORY_CARE_PROVIDER_SITE_OTHER): Payer: Self-pay | Admitting: Physician Assistant

## 2018-09-24 ENCOUNTER — Other Ambulatory Visit: Payer: Self-pay | Admitting: Physician Assistant

## 2018-09-24 VITALS — Temp 98.6°F | Wt 116.0 lb

## 2018-09-24 DIAGNOSIS — K140 Glossitis: Secondary | ICD-10-CM

## 2018-09-24 DIAGNOSIS — N6310 Unspecified lump in the right breast, unspecified quadrant: Secondary | ICD-10-CM

## 2018-09-24 NOTE — Progress Notes (Signed)
Patient: Stacy Zuniga [place] Female    DOB: Mar 13, 1990   28 y.o.   MRN: [id number] Visit Date: 09/24/2018  Today's Provider: Trey SailorsAdriana M Fronia Depass, PA-C   No chief complaint on file.  Subjective:    Virtual Visit via Video Note  I connected with Cledith Dragos on 09/24/18 at  3:40 PM EDT by a video enabled telemedicine application and verified that I am speaking with the correct person using two identifiers.   I discussed the limitations of evaluation and management by telemedicine and the availability of in person appointments. The patient expressed understanding and agreed to proceed.  Patient location: home Provider location: [facility]/home office  Persons involved in the visit: patient, provider   Interactive audio and video communications were attempted, although failed due to patient's inability to connect to video. Continued visit with audio only interaction with patient agreement.    Otalgia  There is pain in both (Right worse than left) ears. Associated symptoms include rhinorrhea and a sore throat. Pertinent negatives include no ear discharge.  Sore Throat  This is a new problem. The current episode started in the past 7 days. Associated symptoms include ear pain and trouble swallowing. Pertinent negatives include no ear discharge.   Describes bumps on her tongue in the back of her tongue that have swollen up. Describes pain as 4/10. Has been using saltwater gargle, tylenol. Used old rx for magic mouthwash which seemed to irritate lesions. No blisters. No fever.   No Known Allergies   Current Outpatient Medications:  .  albuterol (PROVENTIL HFA;VENTOLIN HFA) 108 (90 Base) MCG/ACT inhaler, Inhale 2 puffs into the lungs every 6 (six) hours as needed for wheezing or shortness of breath., Disp: 1 Inhaler, Rfl: 2 .  ammonium lactate (LAC-HYDRIN) 12 % lotion, Apply topically., Disp: , Rfl:  .  dicyclomine (BENTYL) 20 MG tablet, Take 1 tablet (20 mg total) by mouth  4 (four) times daily -  before meals and at bedtime., Disp: 120 tablet, Rfl: 2 .  fluticasone (FLONASE) 50 MCG/ACT nasal spray, Place into the nose., Disp: , Rfl:  .  omeprazole (PRILOSEC) 40 MG capsule, Take by mouth., Disp: , Rfl:  .  magic mouthwash w/lidocaine SOLN, Take 5 mLs by mouth 3 (three) times daily. (Patient not taking: Reported on 09/24/2018), Disp: 120 mL, Rfl: 0  Review of Systems  Constitutional: Negative for activity change, appetite change, chills, diaphoresis, fatigue, fever and unexpected weight change.  HENT: Positive for ear pain, mouth sores, rhinorrhea, sinus pressure, sore throat and trouble swallowing. Negative for ear discharge, postnasal drip, sinus pain, sneezing, tinnitus and voice change.   Respiratory: Negative.   Gastrointestinal: Negative.     Social History   Tobacco Use  . Smoking status: Former Smoker    Types: Cigarettes    Start date: 08/30/2018  . Smokeless tobacco: Never Used  Substance Use Topics  . Alcohol use: Yes    Frequency: Never    Comment: 1 every 3 month      Objective:   Temp 98.6 F (37 C) (Temporal)   Wt 116 lb (52.6 kg)   BMI 23.43 kg/m  Vitals:   09/24/18 1355  Temp: 98.6 F (37 C)  TempSrc: Temporal  Weight: 116 lb (52.6 kg)  Body mass index is 23.43 kg/m.   Physical Exam   No results found for any visits on 09/24/18.     Assessment & Plan    1. Transient lingual papillitis  Video  did not work for visit but it sounds like patient is describing papillae. Does not sound like HSV. Counseled on likely self limiting nature. May use NSAIDs + tylenol for pain relief. Gargle three times daily with liquid benadryl for analgesic effect. May also try chlorseptic spray.   The entirety of the information documented in the History of Present Illness, Review of Systems and Physical Exam were personally obtained by me. Portions of this information were initially documented by Ashley Royalty, CMA and reviewed by me for  thoroughness and accuracy.        Trinna Post, PA-C  [facility]

## 2018-11-01 ENCOUNTER — Ambulatory Visit
Admission: RE | Admit: 2018-11-01 | Discharge: 2018-11-01 | Disposition: A | Discharge status: Home / Self Care | Payer: Self-pay | Source: Ambulatory Visit | Attending: Physician Assistant | Admitting: Physician Assistant

## 2018-11-01 DIAGNOSIS — N6310 Unspecified lump in the right breast, unspecified quadrant: Secondary | ICD-10-CM

## 2019-03-29 ENCOUNTER — Encounter: Payer: Self-pay | Admitting: Physician Assistant

## 2019-03-29 ENCOUNTER — Other Ambulatory Visit: Payer: Self-pay

## 2019-03-29 ENCOUNTER — Ambulatory Visit (INDEPENDENT_AMBULATORY_CARE_PROVIDER_SITE_OTHER): Payer: 59 | Admitting: Physician Assistant

## 2019-03-29 VITALS — BP 106/64 | HR 80 | Temp 97.1°F | Wt 119.8 lb

## 2019-03-29 DIAGNOSIS — E876 Hypokalemia: Secondary | ICD-10-CM | POA: Diagnosis not present

## 2019-03-29 DIAGNOSIS — Z3041 Encounter for surveillance of contraceptive pills: Secondary | ICD-10-CM | POA: Diagnosis not present

## 2019-03-29 DIAGNOSIS — M7540 Impingement syndrome of unspecified shoulder: Secondary | ICD-10-CM

## 2019-03-29 MED ORDER — MELOXICAM 15 MG PO TABS: 15.0000 mg | ORAL_TABLET | Freq: Every day | ORAL | 0 refills | Status: DC

## 2019-03-29 MED ORDER — NORETHIN ACE-ETH ESTRAD-FE 1-20 MG-MCG PO TABS: 1.0000 | ORAL_TABLET | Freq: Every day | ORAL | 3 refills | Status: DC

## 2019-03-29 NOTE — Patient Instructions (Signed)

## 2019-03-29 NOTE — Progress Notes (Signed)
Patient: Stacy Zuniga Female    DOB: May 12, 1990   29 y.o.   MRN: [id number] Visit Date: 03/29/2019  Today's Provider: Trey Sailors, PA-C   Chief Complaint  Patient presents with  . Shoulder Pain   Subjective:     Shoulder Pain  The pain is present in the right shoulder and left shoulder. This is a new problem. The current episode started in the past 7 days. The problem occurs intermittently. The problem has been gradually worsening. The quality of the pain is described as sharp and aching. The pain is at a severity of 4/10. The pain is mild. Associated symptoms include an inability to bear weight, joint swelling and a limited range of motion. Pertinent negatives include no joint locking, numbness, stiffness or tingling. The symptoms are aggravated by activity and lying down. She has tried cold, OTC ointments and NSAIDS for the symptoms. The treatment provided no relief.   Reports she recently just started working again and jumped at [employer] over the weekend. A few days ago she pulled her arm in a certain way that created pulling sensation and a lot of pain. She has used ibuprofen. 2-3 tablets per day which helped moderately. She reports her pain is worse at night. Fell on shoulder as a child. Right shoulder is hurting worse, she is right hand dominant. She is working at [employer] and lifts heavy items. Reaching up lifting a heavy item makes the pain worse. More recently heavy lifting more than normal.   Patient needing refill on birth control. She is on Junel 1-20 mg daily without issue. She denies history of heart attack, blood clot and stroke.   She also has a history of hypokalemia on last labwork.   CMP Latest Ref Rng & Units 08/24/2018 03/22/2018  Glucose 70 - 99 mg/dL 99 73  BUN 6 - 20 mg/dL 9 9  Creatinine 9.67 - 1.00 mg/dL 8.93 8.10(F)  Sodium 751 - 145 mmol/L 138 142  Potassium 3.5 - 5.1 mmol/L 3.0(L) 3.4(L)  Chloride 98 - 111 mmol/L 103 102  CO2 22 - 32  mmol/L 24 22  Calcium 8.9 - 10.3 mg/dL 9.2 9.0  Total Protein 6.5 - 8.1 g/dL 7.7 7.4  Total Bilirubin 0.3 - 1.2 mg/dL 0.7 0.4  Alkaline Phos 38 - 126 U/L 53 63  AST 15 - 41 U/L 28 39  ALT 0 - 44 U/L 32 50(H)    No Known Allergies   Current Outpatient Medications:  .  albuterol (PROVENTIL HFA;VENTOLIN HFA) 108 (90 Base) MCG/ACT inhaler, Inhale 2 puffs into the lungs every 6 (six) hours as needed for wheezing or shortness of breath., Disp: 1 Inhaler, Rfl: 2 .  ammonium lactate (LAC-HYDRIN) 12 % lotion, Apply topically., Disp: , Rfl:  .  dicyclomine (BENTYL) 20 MG tablet, Take 1 tablet (20 mg total) by mouth 4 (four) times daily -  before meals and at bedtime., Disp: 120 tablet, Rfl: 2 .  fluticasone (FLONASE) 50 MCG/ACT nasal spray, Place into the nose., Disp: , Rfl:  .  omeprazole (PRILOSEC) 40 MG capsule, Take by mouth., Disp: , Rfl:  .  magic mouthwash w/lidocaine SOLN, Take 5 mLs by mouth 3 (three) times daily. (Patient not taking: Reported on 09/24/2018), Disp: 120 mL, Rfl: 0 .  meloxicam (MOBIC) 15 MG tablet, Take 1 tablet (15 mg total) by mouth daily., Disp: 30 tablet, Rfl: 0  Review of Systems  Musculoskeletal: Negative for stiffness.  Neurological:  Negative for tingling and numbness.    Social History   Tobacco Use  . Smoking status: Former Smoker    Types: Cigarettes    Start date: 08/30/2018  . Smokeless tobacco: Never Used  Substance Use Topics  . Alcohol use: Yes    Comment: 1 every 3 month      Objective:   BP 106/64 (BP Location: Right Arm, Patient Position: Sitting, Cuff Size: Small)   Pulse 80   Temp (!) 97.1 F (36.2 C) (Temporal)   Wt 119 lb 12.8 oz (54.3 kg)   LMP 03/02/2018 (Exact Date)   BMI 24.20 kg/m  Vitals:   03/29/19 1356  BP: 106/64  Pulse: 80  Temp: (!) 97.1 F (36.2 C)  TempSrc: Temporal  Weight: 119 lb 12.8 oz (54.3 kg)  Body mass index is 24.2 kg/m.   Physical Exam Constitutional:      Appearance: Normal appearance.    Cardiovascular:     Rate and Rhythm: Normal rate.  Pulmonary:     Effort: Pulmonary effort is normal.  Musculoskeletal:     Comments: Some pain bilaterally with belly press and lift off test. Some pain with abduction bilaterally past 90 degrees.   Skin:    General: Skin is warm and dry.  Neurological:     Mental Status: She is alert.  Psychiatric:        Mood and Affect: Mood normal.        Behavior: Behavior normal.      No results found for any visits on 03/29/19.     Assessment & Plan    1. Impingement syndrome of shoulder region, unspecified laterality  - meloxicam (MOBIC) 15 MG tablet; Take 1 tablet (15 mg total) by mouth daily.  Dispense: 30 tablet; Refill: 0  2. Encounter for surveillance of contraceptive pills  - norethindrone-ethinyl estradiol (JUNEL FE 1/20) 1-20 MG-MCG tablet; Take 1 tablet by mouth daily.  Dispense: 3 Package; Refill: 3  3. Hypokalemia  Encouraged her to eat potassium rich foods. Will recheck again today.   - Comprehensive Metabolic Panel (CMET)  The entirety of the information documented in the History of Present Illness, Review of Systems and Physical Exam were personally obtained by me. Portions of this information were initially documented by [facility] and reviewed by me for thoroughness and accuracy.      Trinna Post, PA-C  [facility]

## 2019-03-30 LAB — COMPREHENSIVE METABOLIC PANEL
ALT: 28 IU/L (ref 0–32)
AST: 27 IU/L (ref 0–40)
Albumin/Globulin Ratio: 1.4 (ref 1.2–2.2)
Albumin: 4.3 g/dL (ref 3.9–5.0)
Alkaline Phosphatase: 61 IU/L (ref 39–117)
BUN/Creatinine Ratio: 13 (ref 9–23)
BUN: 7 mg/dL (ref 6–20)
Bilirubin Total: 0.5 mg/dL (ref 0.0–1.2)
CO2: 22 mmol/L (ref 20–29)
Calcium: 8.8 mg/dL (ref 8.7–10.2)
Chloride: 108 mmol/L — ABNORMAL HIGH (ref 96–106)
Creatinine, Ser: 0.53 mg/dL — ABNORMAL LOW (ref 0.57–1.00)
GFR calc Af Amer: 152 mL/min/{1.73_m2} (ref >59)
GFR calc non Af Amer: 131 mL/min/{1.73_m2} (ref >59)
Globulin, Total: 3 g/dL (ref 1.5–4.5)
Glucose: 66 mg/dL (ref 65–99)
Potassium: 4.1 mmol/L (ref 3.5–5.2)
Sodium: 143 mmol/L (ref 134–144)
Total Protein: 7.3 g/dL (ref 6.0–8.5)

## 2019-05-03 ENCOUNTER — Other Ambulatory Visit: Payer: Self-pay | Admitting: Physician Assistant

## 2019-05-03 DIAGNOSIS — M7540 Impingement syndrome of unspecified shoulder: Secondary | ICD-10-CM

## 2019-05-31 ENCOUNTER — Telehealth (INDEPENDENT_AMBULATORY_CARE_PROVIDER_SITE_OTHER): Payer: 59 | Admitting: Physician Assistant

## 2019-05-31 DIAGNOSIS — R0981 Nasal congestion: Secondary | ICD-10-CM

## 2019-05-31 DIAGNOSIS — R05 Cough: Secondary | ICD-10-CM

## 2019-05-31 DIAGNOSIS — Z20822 Contact with and (suspected) exposure to covid-19: Secondary | ICD-10-CM

## 2019-05-31 DIAGNOSIS — R062 Wheezing: Secondary | ICD-10-CM | POA: Diagnosis not present

## 2019-05-31 DIAGNOSIS — R059 Cough, unspecified: Secondary | ICD-10-CM

## 2019-05-31 MED ORDER — ALBUTEROL SULFATE HFA 108 (90 BASE) MCG/ACT IN AERS: 2.0000 | INHALATION_SPRAY | Freq: Four times a day (QID) | RESPIRATORY_TRACT | 0 refills | Status: DC | PRN

## 2019-05-31 MED ORDER — BENZONATATE 100 MG PO CAPS: 100.0000 mg | ORAL_CAPSULE | Freq: Three times a day (TID) | ORAL | 0 refills | Status: AC | PRN

## 2019-05-31 NOTE — Patient Instructions (Signed)
COVID-19 COVID-19 is a respiratory infection that is caused by a virus called severe acute respiratory syndrome coronavirus 2 (SARS-CoV-2). The disease is also known as coronavirus disease or novel coronavirus. In some people, the virus may not cause any symptoms. In others, it may cause a serious infection. The infection can get worse quickly and can lead to complications, such as:  Pneumonia, or infection of the lungs.  Acute respiratory distress syndrome or ARDS. This is a condition in which fluid build-up in the lungs prevents the lungs from filling with air and passing oxygen into the blood.  Acute respiratory failure. This is a condition in which there is not enough oxygen passing from the lungs to the body or when carbon dioxide is not passing from the lungs out of the body.  Sepsis or septic shock. This is a serious bodily reaction to an infection.  Blood clotting problems.  Secondary infections due to bacteria or fungus.  Organ failure. This is when your body's organs stop working. The virus that causes COVID-19 is contagious. This means that it can spread from person to person through droplets from coughs and sneezes (respiratory secretions). What are the causes? This illness is caused by a virus. You may catch the virus by:  Breathing in droplets from an infected person. Droplets can be spread by a person breathing, speaking, singing, coughing, or sneezing.  Touching something, like a table or a doorknob, that was exposed to the virus (contaminated) and then touching your mouth, nose, or eyes. What increases the risk? Risk for infection You are more likely to be infected with this virus if you:  Are within 6 feet (2 meters) of a person with COVID-19.  Provide care for or live with a person who is infected with COVID-19.  Spend time in crowded indoor spaces or live in shared housing. Risk for serious illness You are more likely to become seriously ill from the virus if you:   Are 50 years of age or older. The higher your age, the more you are at risk for serious illness.  Live in a nursing home or long-term care facility.  Have cancer.  Have a long-term (chronic) disease such as: ? Chronic lung disease, including chronic obstructive pulmonary disease or asthma. ? A long-term disease that lowers your body's ability to fight infection (immunocompromised). ? Heart disease, including heart failure, a condition in which the arteries that lead to the heart become narrow or blocked (coronary artery disease), a disease which makes the heart muscle thick, weak, or stiff (cardiomyopathy). ? Diabetes. ? Chronic kidney disease. ? Sickle cell disease, a condition in which red blood cells have an abnormal "sickle" shape. ? Liver disease.  Are obese. What are the signs or symptoms? Symptoms of this condition can range from mild to severe. Symptoms may appear any time from 2 to 14 days after being exposed to the virus. They include:  A fever or chills.  A cough.  Difficulty breathing.  Headaches, body aches, or muscle aches.  Runny or stuffy (congested) nose.  A sore throat.  New loss of taste or smell. Some people may also have stomach problems, such as nausea, vomiting, or diarrhea. Other people may not have any symptoms of COVID-19. How is this diagnosed? This condition may be diagnosed based on:  Your signs and symptoms, especially if: ? You live in an area with a COVID-19 outbreak. ? You recently traveled to or from an area where the virus is common. ? You   provide care for or live with a person who was diagnosed with COVID-19. ? You were exposed to a person who was diagnosed with COVID-19.  A physical exam.  Lab tests, which may include: ? Taking a sample of fluid from the back of your nose and throat (nasopharyngeal fluid), your nose, or your throat using a swab. ? A sample of mucus from your lungs (sputum). ? Blood tests.  Imaging tests, which  may include, X-rays, CT scan, or ultrasound. How is this treated? At present, there is no medicine to treat COVID-19. Medicines that treat other diseases are being used on a trial basis to see if they are effective against COVID-19. Your health care provider will talk with you about ways to treat your symptoms. For most people, the infection is mild and can be managed at home with rest, fluids, and over-the-counter medicines. Treatment for a serious infection usually takes places in a hospital intensive care unit (ICU). It may include one or more of the following treatments. These treatments are given until your symptoms improve.  Receiving fluids and medicines through an IV.  Supplemental oxygen. Extra oxygen is given through a tube in the nose, a face mask, or a hood.  Positioning you to lie on your stomach (prone position). This makes it easier for oxygen to get into the lungs.  Continuous positive airway pressure (CPAP) or bi-level positive airway pressure (BPAP) machine. This treatment uses mild air pressure to keep the airways open. A tube that is connected to a motor delivers oxygen to the body.  Ventilator. This treatment moves air into and out of the lungs by using a tube that is placed in your windpipe.  Tracheostomy. This is a procedure to create a hole in the neck so that a breathing tube can be inserted.  Extracorporeal membrane oxygenation (ECMO). This procedure gives the lungs a chance to recover by taking over the functions of the heart and lungs. It supplies oxygen to the body and removes carbon dioxide. Follow these instructions at home: Lifestyle  If you are sick, stay home except to get medical care. Your health care provider will tell you how long to stay home. Call your health care provider before you go for medical care.  Rest at home as told by your health care provider.  Do not use any products that contain nicotine or tobacco, such as cigarettes, e-cigarettes, and  chewing tobacco. If you need help quitting, ask your health care provider.  Return to your normal activities as told by your health care provider. Ask your health care provider what activities are safe for you. General instructions  Take over-the-counter and prescription medicines only as told by your health care provider.  Drink enough fluid to keep your urine pale yellow.  Keep all follow-up visits as told by your health care provider. This is important. How is this prevented?  There is no vaccine to help prevent COVID-19 infection. However, there are steps you can take to protect yourself and others from this virus. To protect yourself:   Do not travel to areas where COVID-19 is a risk. The areas where COVID-19 is reported change often. To identify high-risk areas and travel restrictions, check the CDC travel website: wwwnc.cdc.gov/travel/notices  If you live in, or must travel to, an area where COVID-19 is a risk, take precautions to avoid infection. ? Stay away from people who are sick. ? Wash your hands often with soap and water for 20 seconds. If soap and water   are not available, use an alcohol-based hand sanitizer. ? Avoid touching your mouth, face, eyes, or nose. ? Avoid going out in public, follow guidance from your state and local health authorities. ? If you must go out in public, wear a cloth face covering or face mask. Make sure your mask covers your nose and mouth. ? Avoid crowded indoor spaces. Stay at least 6 feet (2 meters) away from others. ? Disinfect objects and surfaces that are frequently touched every day. This may include:  Counters and tables.  Doorknobs and light switches.  Sinks and faucets.  Electronics, such as phones, remote controls, keyboards, computers, and tablets. To protect others: If you have symptoms of COVID-19, take steps to prevent the virus from spreading to others.  If you think you have a COVID-19 infection, contact your health care  provider right away. Tell your health care team that you think you may have a COVID-19 infection.  Stay home. Leave your house only to seek medical care. Do not use public transport.  Do not travel while you are sick.  Wash your hands often with soap and water for 20 seconds. If soap and water are not available, use alcohol-based hand sanitizer.  Stay away from other members of your household. Let healthy household members care for children and pets, if possible. If you have to care for children or pets, wash your hands often and wear a mask. If possible, stay in your own room, separate from others. Use a different bathroom.  Make sure that all people in your household wash their hands well and often.  Cough or sneeze into a tissue or your sleeve or elbow. Do not cough or sneeze into your hand or into the air.  Wear a cloth face covering or face mask. Make sure your mask covers your nose and mouth. Where to find more information  Centers for Disease Control and Prevention: www.cdc.gov/coronavirus/2019-ncov/index.html  World Health Organization: www.who.int/health-topics/coronavirus Contact a health care provider if:  You live in or have traveled to an area where COVID-19 is a risk and you have symptoms of the infection.  You have had contact with someone who has COVID-19 and you have symptoms of the infection. Get help right away if:  You have trouble breathing.  You have pain or pressure in your chest.  You have confusion.  You have bluish lips and fingernails.  You have difficulty waking from sleep.  You have symptoms that get worse. These symptoms may represent a serious problem that is an emergency. Do not wait to see if the symptoms will go away. Get medical help right away. Call your local emergency services (911 in the [place] Do not drive yourself to the hospital. Let the emergency medical personnel know if you think you have COVID-19. Summary  COVID-19 is a  respiratory infection that is caused by a virus. It is also known as coronavirus disease or novel coronavirus. It can cause serious infections, such as pneumonia, acute respiratory distress syndrome, acute respiratory failure, or sepsis.  The virus that causes COVID-19 is contagious. This means that it can spread from person to person through droplets from breathing, speaking, singing, coughing, or sneezing.  You are more likely to develop a serious illness if you are 50 years of age or older, have a weak immune system, live in a nursing home, or have chronic disease.  There is no medicine to treat COVID-19. Your health care provider will talk with you about ways to treat your symptoms.    Take steps to protect yourself and others from infection. Wash your hands often and disinfect objects and surfaces that are frequently touched every day. Stay away from people who are sick and wear a mask if you are sick. This information is not intended to replace advice given to you by your health care provider. Make sure you discuss any questions you have with your health care provider. Document Revised: 10/19/2018 Document Reviewed: 01/25/2018 Elsevier Patient Education  2020 [employer] Inc.  

## 2019-05-31 NOTE — Progress Notes (Signed)
MyChart Video Visit    Virtual Visit via Video Note   This visit type was conducted due to national recommendations for restrictions regarding the COVID-19 Pandemic (e.g. social distancing) in an effort to limit this patient's exposure and mitigate transmission in our community. This patient is at least at moderate risk for complications without adequate follow up. This format is felt to be most appropriate for this patient at this time. Physical exam was limited by quality of the video and audio technology used for the visit.   Patient location: Home Provider location: Office    Patient: Stacy Zuniga   DOB: Aug 29, 1990   28 y.o. Female  MRN: [id number] Visit Date: 05/31/2019  Today's healthcare provider: Trey Sailors, PA-C   Chief Complaint  Patient presents with  . URI  I,Porsha C McClurkin,acting as a scribe for [employer], PA-C.,have documented all relevant documentation on the behalf of Trey Sailors, PA-C,as directed by  Trey Sailors, PA-C while in the presence of Trey Sailors, PA-C.  Subjective    URI  This is a new problem. The current episode started in the past 7 days. The problem has been unchanged. There has been no fever. Associated symptoms include congestion, coughing, headaches, rhinorrhea, sneezing, a sore throat and wheezing. Pertinent negatives include no chest pain, diarrhea, nausea, sinus pain or vomiting. She has tried decongestant for the symptoms. The treatment provided mild relief.  Patient has not had her COVID vaccines yet. Reports her symptoms began Monday 05/27/2019.      Medications: Outpatient Medications Prior to Visit  Medication Sig  . albuterol (PROVENTIL HFA;VENTOLIN HFA) 108 (90 Base) MCG/ACT inhaler Inhale 2 puffs into the lungs every 6 (six) hours as needed for wheezing or shortness of breath.  Marland Kitchen ammonium lactate (LAC-HYDRIN) 12 % lotion Apply topically.  . dicyclomine (BENTYL) 20 MG tablet Take 1 tablet (20 mg total)  by mouth 4 (four) times daily -  before meals and at bedtime.  . fluticasone (FLONASE) 50 MCG/ACT nasal spray Place into the nose.  . meloxicam (MOBIC) 15 MG tablet TAKE 1 TABLET BY MOUTH EVERY DAY  . norethindrone-ethinyl estradiol (JUNEL FE 1/20) 1-20 MG-MCG tablet Take 1 tablet by mouth daily.  Marland Kitchen omeprazole (PRILOSEC) 40 MG capsule Take by mouth.   No facility-administered medications prior to visit.    Review of Systems  Constitutional: Negative for appetite change, chills, fatigue and fever.  HENT: Positive for congestion, postnasal drip, rhinorrhea, sinus pressure, sneezing and sore throat. Negative for sinus pain.   Respiratory: Positive for cough, shortness of breath and wheezing.   Cardiovascular: Negative for chest pain.  Gastrointestinal: Negative for diarrhea, nausea and vomiting.  Neurological: Positive for headaches. Negative for weakness.      Objective    There were no vitals taken for this visit.   Physical Exam Constitutional:      Appearance: Normal appearance.  Pulmonary:     Effort: Pulmonary effort is normal. No respiratory distress.  Neurological:     Mental Status: She is alert.  Psychiatric:        Mood and Affect: Mood normal.        Behavior: Behavior normal.        Assessment & Plan    1. Sinus congestion  - symptoms and exam c/w viral URI  - no evidence of strep pharyngitis, CAP, AOM, bacterial sinusitis, or other bacterial infection - concern for possible COVID19 infection - will send for outpatient testing -  discussed need to quarantine 10 days from start of symptoms and until fever-free for at least 48 hours - discussed need to quarantine household members - discussed symptomatic management, natural course, and return precautions   2. Wheezing  - albuterol (VENTOLIN HFA) 108 (90 Base) MCG/ACT inhaler; Inhale 2 puffs into the lungs every 6 (six) hours as needed for wheezing or shortness of breath.  Dispense: 8 g; Refill: 0  3.  Suspected COVID-19 virus infection  - benzonatate (TESSALON PERLES) 100 MG capsule; Take 1 capsule (100 mg total) by mouth 3 (three) times daily as needed for up to 7 days.  Dispense: 21 capsule; Refill: 0  4. Cough  - benzonatate (TESSALON PERLES) 100 MG capsule; Take 1 capsule (100 mg total) by mouth 3 (three) times daily as needed for up to 7 days.  Dispense: 21 capsule; Refill: 0    No follow-ups on file.     I discussed the assessment and treatment plan with the patient. The patient was provided an opportunity to ask questions and all were answered. The patient agreed with the plan and demonstrated an understanding of the instructions.   The patient was advised to call back or seek an in-person evaluation if the symptoms worsen or if the condition fails to improve as anticipated.   ITrinna Post, PA-C, have reviewed all documentation for this visit. The documentation on 05/31/19 for the exam, diagnosis, procedures, and orders are all accurate and complete.   Paulene Floor [facility] [phone] (phone) [phone] (fax)  [place]

## 2019-06-04 ENCOUNTER — Telehealth (INDEPENDENT_AMBULATORY_CARE_PROVIDER_SITE_OTHER): Payer: 59 | Admitting: Physician Assistant

## 2019-06-04 DIAGNOSIS — J069 Acute upper respiratory infection, unspecified: Secondary | ICD-10-CM | POA: Diagnosis not present

## 2019-06-04 NOTE — Progress Notes (Signed)
MyChart Video Visit    Virtual Visit via Video Note   This visit type was conducted due to national recommendations for restrictions regarding the COVID-19 Pandemic (e.g. social distancing) in an effort to limit this patient's exposure and mitigate transmission in our community. This patient is at least at moderate risk for complications without adequate follow up. This format is felt to be most appropriate for this patient at this time. Physical exam was limited by quality of the video and audio technology used for the visit.   Patient location: Home Provider location: Office    Patient: Stacy Zuniga   DOB: Apr 13, 1990   29 y.o. Female  MRN: [id number] Visit Date: 06/04/2019  Today's healthcare provider: Trey Sailors, PA-C   Chief Complaint  Patient presents with  . Nasal Congestion   Subjective    HPI   Patient presenting today for continued nasal congestion and was seen via video visit for this on 05/31/2019. Her COVID test was subsequently negative. She is feeling a majority better. Reports some chest congestion and nasal drainage still. Denies fevers, chills, SOB, nausea, vomiting. Has been using Phenergan DM and OTC herbal medicine at night time to help with sleeping. Concerned about continued duration of cough and congestion.      Medications: Outpatient Medications Prior to Visit  Medication Sig  . albuterol (VENTOLIN HFA) 108 (90 Base) MCG/ACT inhaler Inhale 2 puffs into the lungs every 6 (six) hours as needed for wheezing or shortness of breath.  Marland Kitchen ammonium lactate (LAC-HYDRIN) 12 % lotion Apply topically.  . benzonatate (TESSALON PERLES) 100 MG capsule Take 1 capsule (100 mg total) by mouth 3 (three) times daily as needed for up to 7 days.  Marland Kitchen dicyclomine (BENTYL) 20 MG tablet Take 1 tablet (20 mg total) by mouth 4 (four) times daily -  before meals and at bedtime.  . fluticasone (FLONASE) 50 MCG/ACT nasal spray Place into the nose.  . meloxicam (MOBIC) 15 MG  tablet TAKE 1 TABLET BY MOUTH EVERY DAY  . norethindrone-ethinyl estradiol (JUNEL FE 1/20) 1-20 MG-MCG tablet Take 1 tablet by mouth daily.  Marland Kitchen omeprazole (PRILOSEC) 40 MG capsule Take by mouth.   No facility-administered medications prior to visit.    Review of Systems     Objective    There were no vitals taken for this visit.    Physical Exam Constitutional:      Appearance: Normal appearance.  Pulmonary:     Effort: Pulmonary effort is normal. No respiratory distress.  Neurological:     Mental Status: She is alert.  Psychiatric:        Mood and Affect: Mood normal.        Behavior: Behavior normal.        Assessment & Plan    1. Viral URI  Counseled regarding signs and symptoms of viral and bacterial respiratory infections. Counseled this is a typical course for viral infection. Advised to call or return for additional evaluation if she develops any sign of bacterial infection, or if current symptoms last longer than 10 days. May also consider CXR if symptoms persist. Work note provided.       Return if symptoms worsen or fail to improve.     I discussed the assessment and treatment plan with the patient. The patient was provided an opportunity to ask questions and all were answered. The patient agreed with the plan and demonstrated an understanding of the instructions.   The patient was advised to  call back or seek an in-person evaluation if the symptoms worsen or if the condition fails to improve as anticipated.   ITrinna Post, PA-C, have reviewed all documentation for this visit. The documentation on 06/04/19 for the exam, diagnosis, procedures, and orders are all accurate and complete.   Paulene Floor [facility] [phone] (phone) [phone] (fax)  [place]

## 2019-06-21 ENCOUNTER — Other Ambulatory Visit: Payer: Self-pay | Admitting: Physician Assistant

## 2019-06-21 DIAGNOSIS — R062 Wheezing: Secondary | ICD-10-CM

## 2019-06-21 NOTE — Telephone Encounter (Signed)
Requested medication (s) are due for refill today: no  Requested medication (s) are on the active medication list: yes  Last refill:  05/31/2019  Future visit scheduled: no  Notes to clinic:  One inhaler should last at least one month. If the patient is requesting refills earlier, contact the patient to check for uncontrolled symptoms   Requested Prescriptions  Pending Prescriptions Disp Refills   albuterol (VENTOLIN HFA) 108 (90 Base) MCG/ACT inhaler [Pharmacy Med Name: ALBUTEROL HFA (PROVENTIL) INH]      Sig: TAKE 2 PUFFS BY MOUTH EVERY 6 HOURS AS NEEDED FOR WHEEZE OR SHORTNESS OF BREATH      Pulmonology:  Beta Agonists Failed - 06/21/2019 11:05 AM      Failed - One inhaler should last at least one month. If the patient is requesting refills earlier, contact the patient to check for uncontrolled symptoms.      Passed - Valid encounter within last 12 months    Recent Outpatient Visits           2 weeks ago Viral URI   [facility] [place], [place], [place]   3 weeks ago Sinus congestion   [facility] [place], [place]   2 months ago Impingement syndrome of shoulder region, unspecified laterality   [facility] [place], [place]   9 months ago Transient lingual papillitis   [facility] [place], [place]   9 months ago Abdominal pain, unspecified abdominal location   [facility] [place], [place], [place]

## 2019-07-18 ENCOUNTER — Telehealth: Payer: Self-pay

## 2019-07-18 NOTE — Telephone Encounter (Signed)
She would need OV. Can be virtual.

## 2019-07-18 NOTE — Telephone Encounter (Signed)
Copied from CRM [id number]. Topic: General - Other >> Jul 18, 2019  8:54 AM Gwenlyn Fudge wrote: Reason for CRM: Pt called and is requesting to have a note for work due to food poisoning. Pt states that she missed 07/16/19 and that she was not seen in the office for it. Please advise.

## 2019-07-19 NOTE — Telephone Encounter (Signed)
Tried calling patient and no answer. If patient calls back okay for PEC to advise patient that she will need appointment per [facility].

## 2019-07-22 NOTE — Telephone Encounter (Signed)
Patient called, left VM to call the office to schedule an OV with Ricki Rodriguez if work note is still needed.

## 2019-08-20 NOTE — Progress Notes (Deleted)
     Established patient visit   Patient: Stacy Zuniga   DOB: 25-Jul-1990   29 y.o. Female  MRN: [id number] Visit Date: 08/21/2019  Today's healthcare provider: Trey Sailors, PA-C   No chief complaint on file.  Subjective    HPI  ***  {Show patient history (optional):23778::" "}   Medications: Outpatient Medications Prior to Visit  Medication Sig  . albuterol (VENTOLIN HFA) 108 (90 Base) MCG/ACT inhaler TAKE 2 PUFFS BY MOUTH EVERY 6 HOURS AS NEEDED FOR WHEEZE OR SHORTNESS OF BREATH  . ammonium lactate (LAC-HYDRIN) 12 % lotion Apply topically.  . dicyclomine (BENTYL) 20 MG tablet Take 1 tablet (20 mg total) by mouth 4 (four) times daily -  before meals and at bedtime.  . fluticasone (FLONASE) 50 MCG/ACT nasal spray Place into the nose.  . meloxicam (MOBIC) 15 MG tablet TAKE 1 TABLET BY MOUTH EVERY DAY  . norethindrone-ethinyl estradiol (JUNEL FE 1/20) 1-20 MG-MCG tablet Take 1 tablet by mouth daily.  Marland Kitchen omeprazole (PRILOSEC) 40 MG capsule Take by mouth.   No facility-administered medications prior to visit.    Review of Systems  {Heme  Chem  Endocrine  Serology  Results Review (optional):23779::" "}  Objective    There were no vitals taken for this visit. {Show previous vital signs (optional):23777::" "}  Physical Exam  ***  No results found for any visits on 08/21/19.  Assessment & Plan     ***  No follow-ups on file.      {provider attestation***:1}   Maryella Shivers  [facility] [phone] (phone) [phone] (fax)  [facility]

## 2019-08-21 ENCOUNTER — Ambulatory Visit: Payer: 59 | Admitting: Physician Assistant

## 2019-08-22 ENCOUNTER — Telehealth: Payer: Self-pay | Admitting: Physician Assistant

## 2019-08-22 NOTE — Progress Notes (Deleted)
     Established patient visit   Patient: Stacy Zuniga   DOB: 1990/12/07   29 y.o. Female  MRN: [id number] Visit Date: 08/22/2019  Today's healthcare provider: Trey Sailors, PA-C   No chief complaint on file.  Subjective    HPI  Allergic Rhinitis  {Show patient history (optional):23778::" "}   Medications: Outpatient Medications Prior to Visit  Medication Sig  . albuterol (VENTOLIN HFA) 108 (90 Base) MCG/ACT inhaler TAKE 2 PUFFS BY MOUTH EVERY 6 HOURS AS NEEDED FOR WHEEZE OR SHORTNESS OF BREATH  . ammonium lactate (LAC-HYDRIN) 12 % lotion Apply topically.  . dicyclomine (BENTYL) 20 MG tablet Take 1 tablet (20 mg total) by mouth 4 (four) times daily -  before meals and at bedtime.  . fluticasone (FLONASE) 50 MCG/ACT nasal spray Place into the nose.  . meloxicam (MOBIC) 15 MG tablet TAKE 1 TABLET BY MOUTH EVERY DAY  . norethindrone-ethinyl estradiol (JUNEL FE 1/20) 1-20 MG-MCG tablet Take 1 tablet by mouth daily.  Marland Kitchen omeprazole (PRILOSEC) 40 MG capsule Take by mouth.   No facility-administered medications prior to visit.    Review of Systems  Constitutional: Negative.   HENT: Positive for sneezing.   Respiratory: Negative.   Cardiovascular: Negative.   Neurological: Negative.     {Heme  Chem  Endocrine  Serology  Results Review (optional):23779::" "}  Objective    There were no vitals taken for this visit. {Show previous vital signs (optional):23777::" "}  Physical Exam  ***  No results found for any visits on 08/22/19.  Assessment & Plan     ***  No follow-ups on file.      {provider attestation***:1}   Maryella Shivers  [facility] [phone] (phone) [phone] (fax)  [facility]

## 2019-12-31 ENCOUNTER — Ambulatory Visit: Payer: Self-pay | Admitting: Physician Assistant

## 2020-08-25 IMAGING — US US BREAST*R* LIMITED INC AXILLA
1 series · 4 of 4 positions shown · non-contrast
Comparison: Previous exam(s).

CLINICAL DATA: Patient presents for six-month follow-up of a
probably benign right breast mass.

EXAM:
ULTRASOUND OF THE RIGHT BREAST

[Series 1: us breast*right* limited inc axilla · 0.06mm/px · 4 of 4 slices shown]
[im 1/4]
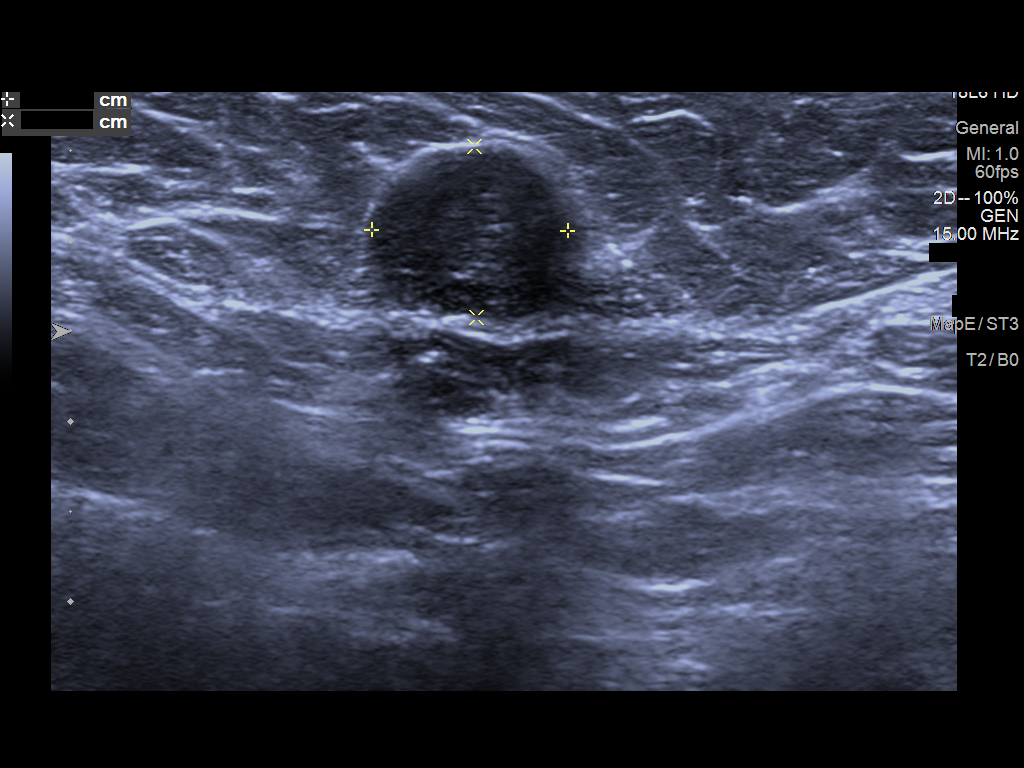
[im 2/4]
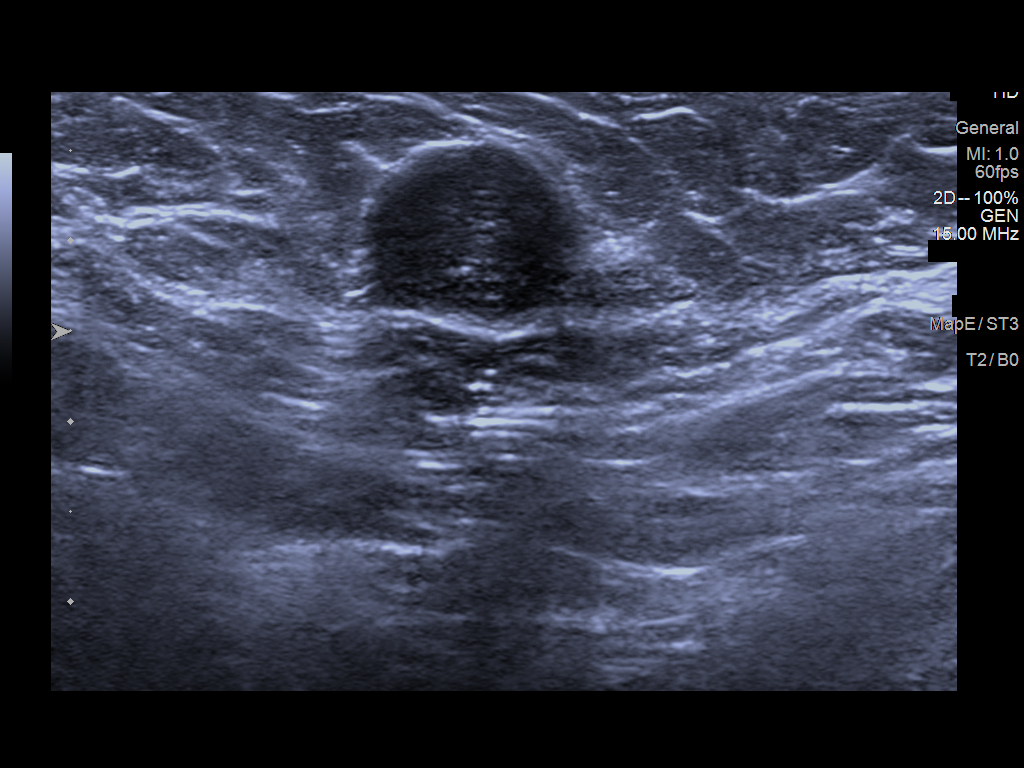
[im 3/4]
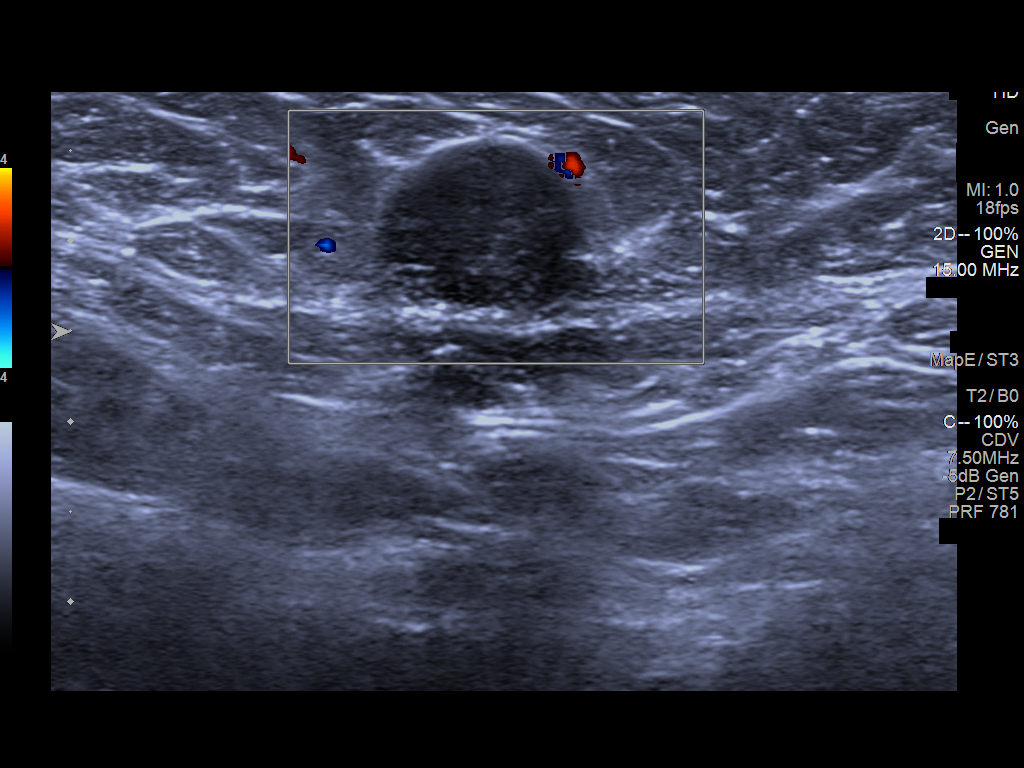
[im 4/4]
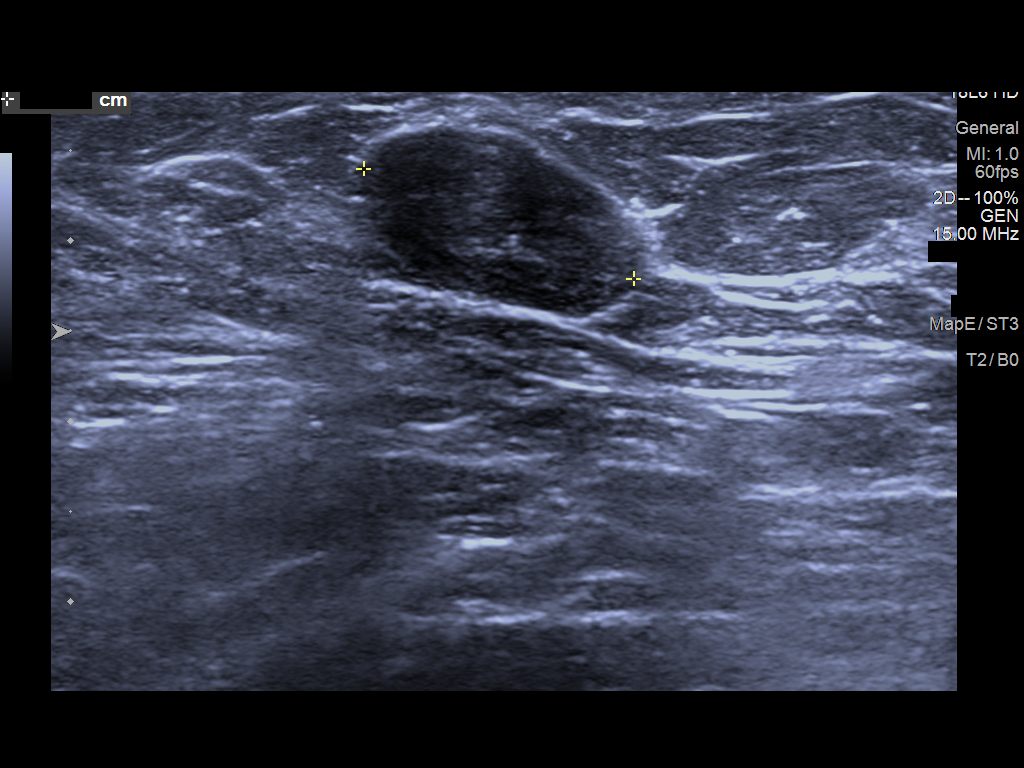

[4 of 4 positions shown; findings below may reference images not displayed]

FINDINGS: Targeted ultrasound is performed in the right breast at 10 o'clock
14 cm from the nipple demonstrating an oval circumscribed hypoechoic
mass measuring 1.6 x 1.0 x 1.1 cm, previously 1.4 x 0.8 x 1.6 cm.
IMPRESSION: Stable to slight decrease in size of a right breast mass at 10
o'clock which is probably benign and likely represents a
fibroadenoma.

RECOMMENDATION:
Targeted right breast ultrasound in 6 months to document 1 year
stability of the right breast mass.

I have discussed the findings and recommendations with the patient.
If applicable, a reminder letter will be sent to the patient
regarding the next appointment.

BI-RADS CATEGORY  3: Probably benign.

## 2020-10-27 ENCOUNTER — Telehealth: Payer: Self-pay

## 2020-10-27 NOTE — Telephone Encounter (Signed)
Copied from CRM [id number]. Topic: Appointment Scheduling - Scheduling Inquiry for Clinic >> Oct 27, 2020  4:36 PM Marylen Ponto wrote: Reason for CRM: Pt would like to schedule appt for annual physical. Pt understands previous provider no longer with [place] so she will need to establish with another provider. Pt would like to remain with the practice and requests call back to schedule appt

## 2020-10-28 NOTE — Telephone Encounter (Signed)
Left VM to call back to schedule appt

## 2021-02-23 ENCOUNTER — Encounter: Payer: Self-pay | Admitting: Family Medicine

## 2021-03-03 ENCOUNTER — Encounter: Payer: Self-pay | Admitting: Physician Assistant

## 2021-03-03 NOTE — Progress Notes (Deleted)
? ? ? ?Complete physical exam ? ? ?Patient: Stacy Zuniga   DOB: [birth year]   31 y.o. Female  MRN: [id number] ?Visit Date: 03/03/2021 ? ?Today's healthcare provider: Debera Lat, PA-C  ? ?No chief complaint on file. ? ?Subjective  ?  ?Stacy Zuniga is a 30 y.o. female who presents today for a complete physical exam.  ?She reports consuming a {diet types:17450} diet. {Exercise:19826} She generally feels {well/fairly well/poorly:18703}. She reports sleeping {well/fairly well/poorly:18703}. She {does/does not:200015} have additional problems to discuss today.  ?HPI  ? ? ?No past medical history on file. ?No past surgical history on file. ?Social History  ? ?Socioeconomic History  ? Marital status: Single  ?  Spouse name: Not on file  ? Number of children: Not on file  ? Years of education: Not on file  ? Highest education level: Not on file  ?Occupational History  ? Not on file  ?Tobacco Use  ? Smoking status: Former  ?  Types: Cigarettes  ?  Start date: 08/30/2018  ? Smokeless tobacco: Never  ?Vaping Use  ? Vaping Use: Never used  ?Substance and Sexual Activity  ? Alcohol use: Yes  ?  Comment: 1 every 3 month  ? 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 Sexual activity: Yes  ?Other Topics Concern  ? Not on file  ?Social History Narrative  ? Not on file  ? ?Social Determinants of Health  ? ?Financial Resource Strain: Not on file  ?Food Insecurity: Not on file  ?Transportation Needs: Not on file  ?Physical Activity: Not on file  ?Stress: Not on file  ?Social Connections: Not on file  ?Intimate Partner Violence: Not on file  ? ?Family Status  ?Relation Name Status  ? Mother  Alive  ? Father  Alive  ? ?No family history on file. ?No Known Allergies  ?Patient Care Team: ?Maryella Shivers (Inactive) as PCP - General (Physician Assistant)  ? ?Medications: ?Outpatient Medications Prior to Visit  ?Medication Sig  ? albuterol (VENTOLIN HFA) 108 (90 Base) MCG/ACT inhaler TAKE 2 PUFFS BY MOUTH EVERY 6 HOURS AS NEEDED  FOR WHEEZE OR SHORTNESS OF BREATH  ? ammonium lactate (LAC-HYDRIN) 12 % lotion Apply topically.  ? dicyclomine (BENTYL) 20 MG tablet Take 1 tablet (20 mg total) by mouth 4 (four) times daily -  before meals and at bedtime.  ? fluticasone (FLONASE) 50 MCG/ACT nasal spray Place into the nose.  ? meloxicam (MOBIC) 15 MG tablet TAKE 1 TABLET BY MOUTH EVERY DAY  ? norethindrone-ethinyl estradiol (JUNEL FE 1/20) 1-20 MG-MCG tablet Take 1 tablet by mouth daily.  ? omeprazole (PRILOSEC) 40 MG capsule Take by mouth.  ? ?No facility-administered medications prior to visit.  ? ? ?Review of Systems  ?All other systems reviewed and are negative. ? ?Last CBC ?Lab Results  ?Component Value Date  ? WBC 9.7 08/24/2018  ? HGB 14.3 08/24/2018  ? HCT 42.2 08/24/2018  ? MCV 87.2 08/24/2018  ? MCH 29.5 08/24/2018  ? RDW 12.9 08/24/2018  ? PLT 299 08/24/2018  ? ?Last metabolic panel ?Lab Results  ?Component Value Date  ? GLUCOSE 66 03/29/2019  ? NA 143 03/29/2019  ? K 4.1 03/29/2019  ? CL 108 (H) 03/29/2019  ? CO2 22 03/29/2019  ? BUN 7 03/29/2019  ? CREATININE 0.53 (L) 03/29/2019  ? GFRNONAA 131 03/29/2019  ? CALCIUM 8.8 03/29/2019  ? PROT 7.3 03/29/2019  ? ALBUMIN 4.3 03/29/2019  ? LABGLOB 3.0 03/29/2019  ?  AGRATIO 1.4 03/29/2019  ? BILITOT 0.5 03/29/2019  ? ALKPHOS 61 03/29/2019  ? AST 27 03/29/2019  ? ALT 28 03/29/2019  ? ANIONGAP 11 08/24/2018  ? ?Last lipids ?Lab Results  ?Component Value Date  ? CHOL 172 03/22/2018  ? HDL 48 03/22/2018  ? LDLCALC 98 03/22/2018  ? TRIG 129 03/22/2018  ? CHOLHDL 3.6 03/22/2018  ? ?Last thyroid functions ?Lab Results  ?Component Value Date  ? TSH 0.673 03/22/2018  ? ?  ? Objective  ?  ?There were no vitals taken for this visit. ?BP Readings from Last 3 Encounters:  ?03/29/19 106/64  ?09/04/18 118/76  ?08/25/18 108/64  ? ?Wt Readings from Last 3 Encounters:  ?03/29/19 119 lb 12.8 oz (54.3 kg)  ?09/24/18 116 lb (52.6 kg)  ?09/04/18 120 lb (54.4 kg)  ? ?  ? ? ?Physical Exam  ?*** ? ?Last depression  screening scores ?PHQ 2/9 Scores 03/22/2018  ?PHQ - 2 Score 0  ? ?Last fall risk screening ?No flowsheet data found. ?Last Audit-C alcohol use screening ?Alcohol Use Disorder Test (AUDIT) 03/22/2018  ?1. How often do you have a drink containing alcohol? 1  ?2. How many drinks containing alcohol do you have on a typical day when you are drinking? 0  ?3. How often do you have six or more drinks on one occasion? 0  ?AUDIT-C Score 1  ?Alcohol Brief Interventions/Follow-up AUDIT Score <7 follow-up not indicated  ? ?A score of 3 or more in women, and 4 or more in men indicates increased risk for alcohol abuse, EXCEPT if all of the points are from question 1  ? ?No results found for any visits on 03/03/21. ? Assessment & Plan  ?  ?Routine Health Maintenance and Physical Exam ? ?Exercise Activities and Dietary recommendations ? Goals   ?None ?  ? ? ?Immunization History  ?Administered Date(s) Administered  ? Tdap 05/11/2011  ? ? ?Health Maintenance  ?Topic Date Due  ? COVID-19 Vaccine (1) Never done  ? Hepatitis C Screening  Never done  ? INFLUENZA VACCINE  08/03/2020  ? PAP-Cervical Cytology Screening  03/13/2021  ? PAP SMEAR-Modifier  03/13/2021  ? TETANUS/TDAP  05/10/2021  ? HIV Screening  Completed  ? HPV VACCINES  Aged Out  ? ? ?Discussed health benefits of physical activity, and encouraged her to engage in regular exercise appropriate for her age and condition. ? ?*** ? ?No follow-ups on file.  ?  ? ?{provider attestation***:1} ? ? ?Debera Lat, PA-C  ?[facility] ?[phone] (phone) ?[phone] (fax) ? ?[facility] ?

## 2021-03-03 NOTE — Progress Notes (Deleted)
? ? ? ?Complete physical exam ? ? ?Patient: Analyah Zuniga   DOB: 22-May-1990   31 y.o. Female  MRN: [id number] ?Visit Date: 03/03/2021 ? ?Today's healthcare provider: Mardene Speak, PA-C  ? ?No chief complaint on file. ? ?Subjective  ?  ?Stacy Zuniga is a 31 y.o. female who presents today for a complete physical exam.  ?She reports consuming a {diet types:17450} diet. {Exercise:19826} She generally feels {well/fairly well/poorly:18703}. She reports sleeping {well/fairly well/poorly:18703}. She {does/does not:200015} have additional problems to discuss today.  ?HPI  ?*** ? ?No past medical history on file. ?No past surgical history on file. ?Social History  ? ?Socioeconomic History  ? Marital status: Single  ?  Spouse name: Not on file  ? Number of children: Not on file  ? Years of education: Not on file  ? Highest education level: Not on file  ?Occupational History  ? Not on file  ?Tobacco Use  ? Smoking status: Former  ?  Types: Cigarettes  ?  Start date: 08/30/2018  ? Smokeless tobacco: Never  ?Vaping Use  ? Vaping Use: Never used  ?Substance and Sexual Activity  ? Alcohol use: Yes  ?  Comment: 1 every 3 month  ? 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 Sexual activity: Yes  ?Other Topics Concern  ? Not on file  ?Social History Narrative  ? Not on file  ? ?Social Determinants of Health  ? ?Financial Resource Strain: Not on file  ?Food Insecurity: Not on file  ?Transportation Needs: Not on file  ?Physical Activity: Not on file  ?Stress: Not on file  ?Social Connections: Not on file  ?Intimate Partner Violence: Not on file  ? ?Family Status  ?Relation Name Status  ? Mother  Alive  ? Father  Alive  ? ?No family history on file. ?No Known Allergies  ?Patient Care Team: ?Paulene Floor (Inactive) as PCP - General (Physician Assistant)  ? ?Medications: ?Outpatient Medications Prior to Visit  ?Medication Sig  ? albuterol (VENTOLIN HFA) 108 (90 Base) MCG/ACT inhaler TAKE 2 PUFFS BY MOUTH EVERY 6 HOURS AS  NEEDED FOR WHEEZE OR SHORTNESS OF BREATH  ? ammonium lactate (LAC-HYDRIN) 12 % lotion Apply topically.  ? dicyclomine (BENTYL) 20 MG tablet Take 1 tablet (20 mg total) by mouth 4 (four) times daily -  before meals and at bedtime.  ? fluticasone (FLONASE) 50 MCG/ACT nasal spray Place into the nose.  ? meloxicam (MOBIC) 15 MG tablet TAKE 1 TABLET BY MOUTH EVERY DAY  ? norethindrone-ethinyl estradiol (JUNEL FE 1/20) 1-20 MG-MCG tablet Take 1 tablet by mouth daily.  ? omeprazole (PRILOSEC) 40 MG capsule Take by mouth.  ? ?No facility-administered medications prior to visit.  ? ? ?Review of Systems ? ?{Labs  Heme  Chem  Endocrine  Serology  Results Review (optional):23779} ? Objective  ?  ?There were no vitals taken for this visit. ?{Show previous vital signs (optional):23777} ? ? ?Physical Exam  ?*** ? ?Last depression screening scores ?PHQ 2/9 Scores 03/22/2018  ?PHQ - 2 Score 0  ? ?Last fall risk screening ?No flowsheet data found. ?Last Audit-C alcohol use screening ?Alcohol Use Disorder Test (AUDIT) 03/22/2018  ?1. How often do you have a drink containing alcohol? 1  ?2. How many drinks containing alcohol do you have on a typical day when you are drinking? 0  ?3. How often do you have six or more drinks on one occasion? 0  ?AUDIT-C Score 1  ?Alcohol  Brief Interventions/Follow-up AUDIT Score <7 follow-up not indicated  ? ?A score of 3 or more in women, and 4 or more in men indicates increased risk for alcohol abuse, EXCEPT if all of the points are from question 1  ? ?No results found for any visits on 03/03/21. ? Assessment & Plan  ?  ?Routine Health Maintenance and Physical Exam ? ?Exercise Activities and Dietary recommendations ? Goals   ?None ?  ? ? ?Immunization History  ?Administered Date(s) Administered  ? Tdap 05/11/2011  ? ? ?Health Maintenance  ?Topic Date Due  ? COVID-19 Vaccine (1) Never done  ? Hepatitis C Screening  Never done  ? INFLUENZA VACCINE  08/03/2020  ? PAP-Cervical Cytology Screening   03/13/2021  ? PAP SMEAR-Modifier  03/13/2021  ? TETANUS/TDAP  05/10/2021  ? HIV Screening  Completed  ? HPV VACCINES  Aged Out  ? ? ?Discussed health benefits of physical activity, and encouraged her to engage in regular exercise appropriate for her age and condition. ? ?*** ? ?No follow-ups on file.  ?  ? ?{provider attestation***:1} ? ? ?Mardene Speak, PA-C  ?[place] ?[phone] (phone) ?[phone] (fax) ? ?[facility]  ?

## 2021-03-04 ENCOUNTER — Other Ambulatory Visit: Payer: Self-pay

## 2021-03-04 ENCOUNTER — Encounter: Payer: Self-pay | Admitting: Physician Assistant

## 2021-03-04 ENCOUNTER — Other Ambulatory Visit (HOSPITAL_COMMUNITY)
Admission: RE | Admit: 2021-03-04 | Discharge: 2021-03-04 | Disposition: A | Discharge status: Home / Self Care | Payer: Self-pay | Source: Ambulatory Visit | Attending: Physician Assistant | Admitting: Physician Assistant

## 2021-03-04 ENCOUNTER — Ambulatory Visit: Payer: Self-pay | Admitting: Physician Assistant

## 2021-03-04 VITALS — BP 105/75 | HR 84 | Temp 98.8°F | Resp 16 | Ht 59.0 in | Wt 128.9 lb

## 2021-03-04 DIAGNOSIS — Z124 Encounter for screening for malignant neoplasm of cervix: Secondary | ICD-10-CM

## 2021-03-04 DIAGNOSIS — Z0000 Encounter for general adult medical examination without abnormal findings: Secondary | ICD-10-CM

## 2021-03-04 NOTE — Patient Instructions (Signed)
? ?  I recommend the following telehealth therapy services to get started ?[email or web address] ?[facility] ?[facility] ? ? ?

## 2021-03-04 NOTE — Progress Notes (Addendum)
Complete physical exam   Patient: Stacy Zuniga   DOB: 1990-06-04   30 y.o. Female  MRN: [id number] Visit Date: 03/04/2021  Today's healthcare provider: Oswaldo Conroy Cynia Abruzzo, PA-C  Introduced myself to the patient as a Secondary school teacher and provided education on APPs in clinical practice.    I,Stacy Zuniga,acting as a scribe for [employer], PA-C.,have documented all relevant documentation on the behalf of Stacy Okuda E Dhriti Fales, PA-C,as directed by  [employer], PA-C while in the presence of Stacy Krinke E Jamaurion Slemmer, PA-C.   Chief Complaint  Patient presents with   Annual Exam   Subjective    Stacy Zuniga is a 31 y.o. female who presents today for a complete physical exam.  She reports consuming a general diet. Home exercise routine includes stretching and cardio. She generally feels well. She reports sleeping fairly well. She does not have additional problems to discuss today.  HPI  Sleep: on a good day gets about 6 hours of sleep and bad days 2-3 hours. 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  Exercise: Stays active around the house. Does cardio and stretches, walking as well.  Diet: Trying to watch macros, somewhat limited due to caretaking duties.   History reviewed. No pertinent past medical history. History reviewed. No pertinent surgical history. Social History   Socioeconomic History   Marital status: Single    Spouse name: Not on file   Number of children: Not on file   Years of education: Not on file   Highest education level: Not on file  Occupational History   Not on file  Tobacco Use   Smoking status: Former    Types: Cigarettes    Start date: 08/30/2018   Smokeless tobacco: Never  Vaping Use   Vaping Use: Never used  Substance and Sexual Activity   Alcohol use: Yes    Comment: 1 every 3 month   Drug use: Yes    Types: Ketamine, Marijuana   Sexual activity: Yes   Other Topics Concern   Not on file  Social History Narrative   Not on file   Social Determinants of Health   Financial Resource Strain: Not on file  Food Insecurity: Not on file  Transportation Needs: Not on file  Physical Activity: Not on file  Stress: Not on file  Social Connections: Not on file  Intimate Partner Violence: Not on file   Family Status  Relation Name Status   Mother  Alive   Father  Alive   History reviewed. No pertinent family history. No Known Allergies  Patient Care Team: Jacky Kindle, FNP as PCP - General (Family Medicine)   Medications: Outpatient Medications Prior to Visit  Medication Sig   albuterol (VENTOLIN HFA) 108 (90 Base) MCG/ACT inhaler TAKE 2 PUFFS BY MOUTH EVERY 6 HOURS AS NEEDED FOR WHEEZE OR SHORTNESS OF BREATH   fluticasone (FLONASE) 50 MCG/ACT nasal spray Place into the nose.   omeprazole (PRILOSEC) 40 MG capsule Take by mouth.   ammonium lactate (LAC-HYDRIN) 12 % lotion Apply topically.   dicyclomine (BENTYL) 20 MG tablet Take 1 tablet (20 mg total) by mouth 4 (four) times daily -  before meals and at bedtime.   meloxicam (MOBIC) 15 MG tablet TAKE 1 TABLET BY MOUTH EVERY DAY   norethindrone-ethinyl estradiol (JUNEL FE 1/20) 1-20 MG-MCG tablet Take 1 tablet by mouth daily. (  Patient not taking: Reported on 03/04/2021)   No facility-administered medications prior to visit.    Review of Systems  Constitutional: Negative.   HENT:  Positive for sinus pressure and tinnitus.   Eyes: Negative.   Respiratory: Negative.    Cardiovascular: Negative.   Gastrointestinal:  Positive for constipation.  Endocrine: Negative.   Genitourinary: Negative.   Musculoskeletal: Negative.   Skin: Negative.   Allergic/Immunologic: Positive for environmental allergies.  Neurological:  Positive for dizziness and light-headedness.  Hematological: Negative.   Psychiatric/Behavioral: Negative.    All other systems reviewed and are negative.    Objective     BP 105/75 (BP Location: Left Arm, Patient Position: Sitting, Cuff Size: Normal)    Pulse 84    Temp 98.8 F (37.1 C) (Temporal)    Resp 16    Ht 4\' 11"  (1.499 m)    Wt 128 lb 14.4 oz (58.5 kg)    LMP 02/12/2021    BMI 26.03 kg/m     Physical Exam Vitals reviewed. Exam conducted with a chaperone present.  Constitutional:      General: She is awake.     Appearance: Normal appearance. She is well-developed, well-groomed and normal weight.  HENT:     Head: Normocephalic and atraumatic.     Right Ear: Tympanic membrane, ear canal and external ear normal.     Left Ear: Tympanic membrane, ear canal and external ear normal.     Mouth/Throat:     Mouth: Mucous membranes are moist.     Pharynx: Oropharynx is clear. No oropharyngeal exudate or posterior oropharyngeal erythema.  Eyes:     General: Lids are normal.     Extraocular Movements: Extraocular movements intact.     Conjunctiva/sclera: Conjunctivae normal.     Pupils: Pupils are equal, round, and reactive to light.     Comments:  Left eye has circular ring around iris   Cardiovascular:     Rate and Rhythm: Normal rate and regular rhythm.     Pulses: Normal pulses.     Heart sounds: Normal heart sounds.  Pulmonary:     Effort: Pulmonary effort is normal.     Breath sounds: Normal breath sounds. No wheezing, rhonchi or rales.  Chest:     Chest wall: Tenderness present. No deformity.  Breasts:    Tanner Score is 5.     Breasts are symmetrical.     Right: Normal.     Left: Normal.  Abdominal:     General: Abdomen is flat. Bowel sounds are normal.     Palpations: Abdomen is soft.  Genitourinary:    General: Normal vulva.     Pubic Area: No rash or pubic lice.      Tanner stage (genital): 5.     Labia:        Right: No rash, tenderness or lesion.        Left: No rash, tenderness or lesion.      Vagina: Normal. No erythema or tenderness.     Cervix: Normal.  Musculoskeletal:     Cervical back: Normal range of motion and  neck supple.  Lymphadenopathy:     Upper Body:     Right upper body: No supraclavicular, axillary or pectoral adenopathy.     Left upper body: No supraclavicular, axillary or pectoral adenopathy.  Skin:    General: Skin is warm and dry.  Neurological:     General: No focal deficit present.     Mental Status: She is  alert and oriented to person, place, and time.  Psychiatric:        Mood and Affect: Mood normal.        Behavior: Behavior normal. Behavior is cooperative.        Thought Content: Thought content normal.        Judgment: Judgment normal.      Last depression screening scores   Depression screen [facility] 2/9 03/04/2021 03/22/2018  Decreased Interest 1 0  Down, Depressed, Hopeless 0 0  PHQ - 2 Score 1 0  Altered sleeping 3 -  Tired, decreased energy 3 -  Change in appetite 3 -  Feeling bad or failure about yourself  3 -  Trouble concentrating 0 -  Moving slowly or fidgety/restless 0 -  Suicidal thoughts 0 -  PHQ-9 Score 13 -  Difficult doing work/chores Not difficult at all -    [facility] 2/9 Scores 03/04/2021 03/22/2018  PHQ - 2 Score 1 0  PHQ- 9 Score 13 -   Last fall risk screening Fall Risk  03/04/2021  Falls in the past year? 0  Number falls in past yr: 0  Injury with Fall? 0   Last Audit-C alcohol use screening Alcohol Use Disorder Test (AUDIT) 03/04/2021  1. How often do you have a drink containing alcohol? 1  2. How many drinks containing alcohol do you have on a typical day when you are drinking? 0  3. How often do you have six or more drinks on one occasion? 1  AUDIT-C Score 2  Alcohol Brief Interventions/Follow-up -   A score of 3 or more in women, and 4 or more in men indicates increased risk for alcohol abuse, EXCEPT if all of the points are from question 1   No results found for any visits on 03/04/21.  Assessment & Plan    Routine Health Maintenance and Physical Exam  Exercise Activities and Dietary recommendations  Goals   None     Immunization  History  Administered Date(s) Administered   Tdap 05/11/2011    Health Maintenance  Topic Date Due   Hepatitis C Screening  Never done   PAP-Cervical Cytology Screening  03/13/2021   PAP SMEAR-Modifier  03/13/2021   INFLUENZA VACCINE  04/02/2021 (Originally 08/03/2020)   COVID-19 Vaccine (1) 09/03/2021 (Originally 12/02/1990)   TETANUS/TDAP  05/10/2021   HIV Screening  Completed   HPV VACCINES  Aged Out    Discussed health benefits of physical activity, and encouraged her to engage in regular exercise appropriate for her age and condition.   No follow-ups on file.     Problem List Items Addressed This Visit   None Visit Diagnoses     Annual physical exam    -  Primary Discussed depression screening scores and health maintenance topics  Recommend COVID vaccination to assist with preventive health measures.  Discussed sleep, mood and exercise Recommend 150 minutes of moderate intensity physical activity per week Lab work for monitoring - results to dictate further management.     Relevant Orders   Lipid panel   Comprehensive metabolic panel   Cytology - PAP   Comprehensive Metabolic Panel (CMET)   CBC w/Diff/Platelet   Lipid Profile   Hepatitis C Antibody   Cervical cancer screening       Relevant Orders   Cytology - PAP        No follow-ups on file.   I, Keidra Withers E Chesney Klimaszewski, PA-C, have reviewed all documentation for this visit. The documentation on 03/04/21  for the exam, diagnosis, procedures, and orders are all accurate and complete.   Viral Schramm, Mirian MoErin E, PA-C MPH [facility]   Roselind Messierrin E Delylah Stanczyk, PA-C  [facility] [phone] (phone) [phone] (fax)  [facility]

## 2021-03-10 LAB — CYTOLOGY - PAP
Chlamydia: NEGATIVE
Comment: NEGATIVE
Comment: NEGATIVE
Comment: NEGATIVE
Comment: NORMAL
Diagnosis: NEGATIVE
HSV1: NEGATIVE
HSV2: NEGATIVE
Neisseria Gonorrhea: NEGATIVE
Trichomonas: NEGATIVE

## 2022-03-09 ENCOUNTER — Ambulatory Visit: Payer: Self-pay

## 2022-03-09 ENCOUNTER — Ambulatory Visit: Payer: Medicaid Other | Admitting: Family Medicine

## 2022-03-09 ENCOUNTER — Encounter: Payer: Self-pay | Admitting: Family Medicine

## 2022-03-09 VITALS — BP 118/84 | HR 111 | Temp 98.5°F | Ht 59.5 in | Wt 131.0 lb

## 2022-03-09 DIAGNOSIS — Z131 Encounter for screening for diabetes mellitus: Secondary | ICD-10-CM | POA: Diagnosis not present

## 2022-03-09 DIAGNOSIS — N926 Irregular menstruation, unspecified: Secondary | ICD-10-CM

## 2022-03-09 DIAGNOSIS — O209 Hemorrhage in early pregnancy, unspecified: Secondary | ICD-10-CM

## 2022-03-09 DIAGNOSIS — Z1329 Encounter for screening for other suspected endocrine disorder: Secondary | ICD-10-CM | POA: Diagnosis not present

## 2022-03-09 DIAGNOSIS — Z3201 Encounter for pregnancy test, result positive: Secondary | ICD-10-CM

## 2022-03-09 DIAGNOSIS — Z13220 Encounter for screening for lipoid disorders: Secondary | ICD-10-CM

## 2022-03-09 DIAGNOSIS — R000 Tachycardia, unspecified: Secondary | ICD-10-CM | POA: Diagnosis not present

## 2022-03-09 LAB — POCT URINE PREGNANCY: Preg Test, Ur: NEGATIVE

## 2022-03-09 NOTE — Telephone Encounter (Signed)
    Chief Complaint: Pt. Had positive pregnancy test last week. Yesterday had bleeding and passed clot. Pregnancy test now negative. Appointment with GYN 03/23/22. Symptoms: Above Frequency: Yesterday Pertinent Negatives: Patient denies  Disposition: '[]'$ ED /'[]'$ Urgent Care (no appt availability in office) / '[x]'$ Appointment(In office/virtual)/ '[]'$  [facility]/ '[]'$ Home Care/ '[]'$ Refused Recommended Disposition /'[]'$ [facility]/ '[]'$  Follow-up with PCP Additional Notes:   Reason for Disposition  [1] Intermittent lower abdominal pain (e.g., cramping) AND [2] present > 24 hours  Answer Assessment - Initial Assessment Questions 1. ONSET: "When did this bleeding start?"       Yesterday - had a clot 2. DESCRIPTION: "Describe the bleeding that you are having." "How much bleeding is there?"    - SPOTTING: spotting, or pinkish / brownish mucous discharge; does not fill panty liner or pad    - MILD:  less than 1 pad / hour; less than patient's usual menstrual bleeding   - MODERATE: 1-2 pads / hour; 1 menstrual cup every 6 hours; small-medium blood clots (e.g., pea, grape, small coin)   - SEVERE: soaking 2 or more pads/hour for 2 or more hours; 1 menstrual cup every 2 hours; bleeding not contained by pads or continuous red blood from vagina; large blood clots (e.g., golf ball, large coin)      Mild 3. ABDOMINAL PAIN SEVERITY: If present, ask: "How bad is it?"  (e.g., Scale 1-10; mild, moderate, or severe)   - MILD (1-3): doesn't interfere with normal activities, abdomen soft and not tender to touch    - MODERATE (4-7): interferes with normal activities or awakens from sleep, abdomen tender to touch    - SEVERE (8-10): excruciating pain, doubled over, unable to do any normal activities     Now - 2-3 4. PREGNANCY: "Do you know how many weeks or months pregnant you are?" "When was the first day of your last normal menstrual period?"     Did home test 5. HEMODYNAMIC STATUS: "Are you weak or  feeling lightheaded?" If Yes, ask: "Can you stand and walk normally?"      No 6. OTHER SYMPTOMS: "What other symptoms are you having with the bleeding?" (e.g., passed tissue, vaginal discharge, fever, menstrual-type cramps)     No  Protocols used: Pregnancy - Vaginal Bleeding Less Than [redacted] Weeks EGA-A-AH

## 2022-03-09 NOTE — Assessment & Plan Note (Signed)
Home pregnancy x3 positive last weekend; - today x2 home and inoffice

## 2022-03-09 NOTE — Assessment & Plan Note (Signed)
POC urine pregnancy positive --> now negative; will complete blood work Has appt scheduled with OB

## 2022-03-09 NOTE — Patient Instructions (Signed)
Seek emergent care for fevers or abdominal pain uncontrolled with tylenol.  Keep OB appt later this month.  Labs should be resulted 03/10/22; our office will call you to assist with understanding lab results

## 2022-03-09 NOTE — Progress Notes (Signed)
Established patient visit  Patient: Stacy Zuniga   DOB: 1990-07-17   32 y.o. Female  MRN: [id number] Visit Date: 03/09/2022  Today's healthcare provider: Gwyneth Sprout, FNP  Introduced to nurse practitioner role and practice setting.  All questions answered.  Discussed provider/patient relationship and expectations.  Chief Complaint  Patient presents with   Vaginal Bleeding   Subjective    HPI HPI   Pt stated--vaginal bleed/ clotting, lower abdominal discomfort especially when wiping--started yesterday. Last edited by Elta Guadeloupe, CMA on 03/09/2022  1:02 PM.      Patient reports positive pregnancy test last week. Yesterday she had bleeding and passed clot. She reports pregnancy test now negative. Is scheduled to see GYN on 03/23/22.   Medications: Outpatient Medications Prior to Visit  Medication Sig   albuterol (VENTOLIN HFA) 108 (90 Base) MCG/ACT inhaler TAKE 2 PUFFS BY MOUTH EVERY 6 HOURS AS NEEDED FOR WHEEZE OR SHORTNESS OF BREATH   dicyclomine (BENTYL) 20 MG tablet Take 1 tablet (20 mg total) by mouth 4 (four) times daily -  before meals and at bedtime.   fluticasone (FLONASE) 50 MCG/ACT nasal spray Place into the nose. (Patient not taking: Reported on 03/09/2022)   omeprazole (PRILOSEC) 40 MG capsule Take by mouth. (Patient not taking: Reported on 03/09/2022)   No facility-administered medications prior to visit.   Review of Systems     Objective    BP 118/84 (BP Location: Right Arm, Patient Position: Sitting, Cuff Size: Normal)   Pulse (!) 111   Temp 98.5 F (36.9 C)   Ht 4' 11.5" (1.511 m)   Wt 131 lb (59.4 kg)   LMP 01/24/2022   SpO2 98%   BMI 26.02 kg/m    Physical Exam Vitals and nursing note reviewed.  Constitutional:      General: She is not in acute distress.    Appearance: Normal appearance. She is overweight. She is not ill-appearing, toxic-appearing or diaphoretic.  HENT:     Head: Normocephalic and atraumatic.  Cardiovascular:     Rate and  Rhythm: Regular rhythm. Tachycardia present.     Pulses: Normal pulses.     Heart sounds: Normal heart sounds. No murmur heard.    No friction rub. No gallop.  Pulmonary:     Effort: Pulmonary effort is normal. No respiratory distress.     Breath sounds: Normal breath sounds. No stridor. No wheezing, rhonchi or rales.  Chest:     Chest wall: No tenderness.  Abdominal:     General: Bowel sounds are normal.     Palpations: Abdomen is soft.     Tenderness: There is abdominal tenderness.  Musculoskeletal:        General: No swelling, tenderness, deformity or signs of injury. Normal range of motion.     Right lower leg: No edema.     Left lower leg: No edema.  Skin:    General: Skin is warm and dry.     Capillary Refill: Capillary refill takes less than 2 seconds.     Coloration: Skin is not jaundiced or pale.     Findings: No bruising, erythema, lesion or rash.  Neurological:     General: No focal deficit present.     Mental Status: She is alert and oriented to person, place, and time. Mental status is at baseline.     Cranial Nerves: No cranial nerve deficit.     Sensory: No sensory deficit.     Motor: No weakness.  Coordination: Coordination normal.  Psychiatric:        Mood and Affect: Mood is anxious. Affect is flat and tearful.        Behavior: Behavior normal.        Thought Content: Thought content normal.        Judgment: Judgment normal.     Results for orders placed or performed in visit on 03/09/22  POCT urine pregnancy  Result Value Ref Range   Preg Test, Ur Negative Negative    Assessment & Plan     Problem List Items Addressed This Visit       Genitourinary   Vaginal bleeding affecting early pregnancy - Primary    POC urine pregnancy positive --> now negative; will complete blood work Has appt scheduled with OB      Relevant Orders   CBC with Differential/Platelet   POCT urine pregnancy (Completed)     Other   Missed period    Missed cycle in  2/24; confirmed last day of cycle 01/25/22      Relevant Orders   hCG, serum, qualitative   Beta HCG, Quant   Positive pregnancy test    Home pregnancy x3 positive last weekend; - today x2 home and inoffice      Relevant Orders   hCG, serum, qualitative   Beta HCG, Quant   Screening for diabetes mellitus   Relevant Orders   Hemoglobin A1c   Screening for thyroid disorder   Relevant Orders   TSH + free T4   Screening, lipid   Relevant Orders   Lipid panel   Tachycardia with heart rate 100-120 beats per minute   Relevant Orders   CBC with Differential/Platelet   Comprehensive Metabolic Panel (CMET)   TSH + free T4   Return if symptoms worsen or fail to improve, for annual examination- schedule at your convenience.     Vonna Kotyk, FNP, have reviewed all documentation for this visit. The documentation on 03/09/22 for the exam, diagnosis, procedures, and orders are all accurate and complete.  Gwyneth Sprout, [place] [phone] (phone) [phone] (fax)  [place]

## 2022-03-09 NOTE — Assessment & Plan Note (Signed)
Missed cycle in 2/24; confirmed last day of cycle 01/25/22

## 2022-03-10 LAB — COMPREHENSIVE METABOLIC PANEL
ALT: 43 IU/L — ABNORMAL HIGH (ref 0–32)
AST: 26 IU/L (ref 0–40)
Albumin/Globulin Ratio: 1.4 (ref 1.2–2.2)
Albumin: 4.6 g/dL (ref 4.0–5.0)
Alkaline Phosphatase: 67 IU/L (ref 44–121)
BUN/Creatinine Ratio: 12 (ref 9–23)
BUN: 7 mg/dL (ref 6–20)
Bilirubin Total: 0.3 mg/dL (ref 0.0–1.2)
CO2: 21 mmol/L (ref 20–29)
Calcium: 9.4 mg/dL (ref 8.7–10.2)
Chloride: 106 mmol/L (ref 96–106)
Creatinine, Ser: 0.59 mg/dL (ref 0.57–1.00)
Globulin, Total: 3.2 g/dL (ref 1.5–4.5)
Glucose: 109 mg/dL — ABNORMAL HIGH (ref 70–99)
Potassium: 4.1 mmol/L (ref 3.5–5.2)
Sodium: 143 mmol/L (ref 134–144)
Total Protein: 7.8 g/dL (ref 6.0–8.5)
eGFR: 125 mL/min/{1.73_m2} (ref >59)

## 2022-03-10 LAB — CBC WITH DIFFERENTIAL/PLATELET
Basophils Absolute: 0.1 10*3/uL (ref 0.0–0.2)
Basos: 1 %
EOS (ABSOLUTE): 0 10*3/uL (ref 0.0–0.4)
Eos: 0 %
Hematocrit: 43.1 % (ref 34.0–46.6)
Hemoglobin: 14.8 g/dL (ref 11.1–15.9)
Immature Grans (Abs): 0.1 10*3/uL (ref 0.0–0.1)
Immature Granulocytes: 1 %
Lymphocytes Absolute: 2.1 10*3/uL (ref 0.7–3.1)
Lymphs: 24 %
MCH: 29.8 pg (ref 26.6–33.0)
MCHC: 34.3 g/dL (ref 31.5–35.7)
MCV: 87 fL (ref 79–97)
Monocytes Absolute: 0.4 10*3/uL (ref 0.1–0.9)
Monocytes: 5 %
Neutrophils Absolute: 5.9 10*3/uL (ref 1.4–7.0)
Neutrophils: 69 %
Platelets: 377 10*3/uL (ref 150–450)
RBC: 4.97 x10E6/uL (ref 3.77–5.28)
RDW: 12.5 % (ref 11.7–15.4)
WBC: 8.5 10*3/uL (ref 3.4–10.8)

## 2022-03-10 LAB — HEMOGLOBIN A1C
Est. average glucose Bld gHb Est-mCnc: 111 mg/dL
Hgb A1c MFr Bld: 5.5 % (ref 4.8–5.6)

## 2022-03-10 LAB — HCG, SERUM, QUALITATIVE

## 2022-03-10 LAB — BETA HCG QUANT (REF LAB): hCG Quant: 2 m[IU]/mL

## 2022-03-10 LAB — TSH+FREE T4
Free T4: 1.34 ng/dL (ref 0.82–1.77)
TSH: 0.606 u[IU]/mL (ref 0.450–4.500)

## 2022-03-10 LAB — LIPID PANEL
Chol/HDL Ratio: 3.8 ratio (ref 0.0–4.4)
Cholesterol, Total: 191 mg/dL (ref 100–199)
HDL: 50 mg/dL (ref >39)
LDL Chol Calc (NIH): 121 mg/dL — ABNORMAL HIGH (ref 0–99)
Triglycerides: 109 mg/dL (ref 0–149)
VLDL Cholesterol Cal: 20 mg/dL (ref 5–40)

## 2022-03-10 NOTE — Progress Notes (Signed)
I'm sorry to inform you that your blood work confirms a loss of pregnancy. hCG expected >158 for last menses; reading at 2. Please continue to follow up with Dr Leafy Ro to discuss when to try for another baby if desired. Please let us know if you have any questions.  Thank you, Gwyneth Sprout, [id number] [place], [place] [address] [phone] (phone) [phone] (fax) [place]

## 2022-03-28 ENCOUNTER — Ambulatory Visit: Payer: Self-pay | Admitting: *Deleted

## 2022-03-28 ENCOUNTER — Other Ambulatory Visit (HOSPITAL_COMMUNITY)
Admission: RE | Admit: 2022-03-28 | Discharge: 2022-03-28 | Disposition: A | Discharge status: Home / Self Care | Payer: Medicaid Other | Source: Ambulatory Visit | Attending: Family Medicine | Admitting: Family Medicine

## 2022-03-28 ENCOUNTER — Encounter: Payer: Self-pay | Admitting: Family Medicine

## 2022-03-28 ENCOUNTER — Ambulatory Visit: Payer: Medicaid Other | Admitting: Family Medicine

## 2022-03-28 VITALS — BP 108/72 | HR 93 | Temp 98.5°F | Resp 15 | Ht 59.0 in | Wt 133.3 lb

## 2022-03-28 DIAGNOSIS — M545 Low back pain, unspecified: Secondary | ICD-10-CM | POA: Diagnosis present

## 2022-03-28 DIAGNOSIS — R809 Proteinuria, unspecified: Secondary | ICD-10-CM | POA: Insufficient documentation

## 2022-03-28 DIAGNOSIS — F4322 Adjustment disorder with anxiety: Secondary | ICD-10-CM

## 2022-03-28 DIAGNOSIS — R350 Frequency of micturition: Secondary | ICD-10-CM | POA: Diagnosis not present

## 2022-03-28 DIAGNOSIS — F419 Anxiety disorder, unspecified: Secondary | ICD-10-CM | POA: Insufficient documentation

## 2022-03-28 LAB — POCT URINALYSIS DIPSTICK
Bilirubin, UA: NEGATIVE
Blood, UA: NEGATIVE
Clarity, UA: NEGATIVE
Color, UA: NEGATIVE
Glucose, UA: NEGATIVE
Ketones, UA: NEGATIVE
Leukocytes, UA: NEGATIVE
Nitrite, UA: NEGATIVE
Protein, UA: POSITIVE — AB
Spec Grav, UA: 1.01 (ref 1.010–1.025)
Urobilinogen, UA: 0.2 E.U./dL
pH, UA: 6.5 (ref 5.0–8.0)

## 2022-03-28 MED ORDER — NITROFURANTOIN MONOHYD MACRO 100 MG PO CAPS: 100.0000 mg | ORAL_CAPSULE | Freq: Two times a day (BID) | ORAL | 0 refills | Status: DC

## 2022-03-28 NOTE — Telephone Encounter (Signed)
  Chief Complaint: Dysuria Symptoms: Urinary frequency and urgency, dysuria, lower back pain Frequency: Wednesday Pertinent Negatives: Patient denies fever, hematuria Disposition: [] ED /[] Urgent Care (no appt availability in office) / [x] Appointment(In office/virtual)/ []  [place] [facility]/ [] Home Care/ [] Refused Recommended Disposition /[] [facility]/ []  Follow-up with PCP Additional Notes: Appt secured for this afternoon. Care advise provided, pt verbalizes understanding.  Reason for Disposition  Side (flank) or lower back pain present  Answer Assessment - Initial Assessment Questions 1. SYMPTOM: "What's the main symptom you're concerned about?" (e.g., frequency, incontinence)     Frequency, urgency 2. ONSET: "When did the    start?"     Wednesday 3. PAIN: "Is there any pain?" If Yes, ask: "How bad is it?" (Scale: 1-10; mild, moderate, severe)     Yes lower back 4. CAUSE: "What do you think is causing the symptoms?"     Maybe ITI 5. OTHER SYMPTOMS: "Do you have any other symptoms?" (e.g., blood in urine, fever, flank pain, pain with urination)     Pain with urination, before and after 6. PREGNANCY: "Is there any chance you are pregnant?" "When was your last menstrual period?"     No  Protocols used: Urinary Symptoms-A-AH

## 2022-03-28 NOTE — Progress Notes (Signed)
Established patient visit  Patient: Stacy Zuniga   DOB: 22-Sep-1990   32 y.o. Female  MRN: [id number] Visit Date: 03/28/2022  Today's healthcare provider: Gwyneth Sprout, FNP  Re Introduced to nurse practitioner role and practice setting.  All questions answered.  Discussed provider/patient relationship and expectations.  CC: Urinary frequency   Subjective    Urinary Tract Infection  This is a new problem. The current episode started in the past 7 days. Episode frequency: Initally, pain would go away with urination. The problem has been rapidly worsening. The quality of the pain is described as stabbing and shooting. The pain is at a severity of 7/10. The pain is moderate. She is Sexually active (Sex Wednesday). Associated symptoms include nausea and urgency. Pertinent negatives include no discharge. Associated symptoms comments: Acid reflux . She has tried nothing for the symptoms. The treatment provided no relief. There is no history of kidney stones or recurrent UTIs. yeast infection  Patient reports that pain started 03/19/2022, she says she started taking cranberry extract which provided mild relief. Back pains started 3 days after  Medications: Outpatient Medications Prior to Visit  Medication Sig   [DISCONTINUED] albuterol (VENTOLIN HFA) 108 (90 Base) MCG/ACT inhaler TAKE 2 PUFFS BY MOUTH EVERY 6 HOURS AS NEEDED FOR WHEEZE OR SHORTNESS OF BREATH   [DISCONTINUED] dicyclomine (BENTYL) 20 MG tablet Take 1 tablet (20 mg total) by mouth 4 (four) times daily -  before meals and at bedtime.   [DISCONTINUED] fluticasone (FLONASE) 50 MCG/ACT nasal spray Place into the nose. (Patient not taking: Reported on 03/09/2022)   [DISCONTINUED] omeprazole (PRILOSEC) 40 MG capsule Take by mouth. (Patient not taking: Reported on 03/09/2022)   No facility-administered medications prior to visit.    Review of Systems  Constitutional:  Positive for appetite change.  Gastrointestinal:  Positive for nausea.   Genitourinary:  Positive for difficulty urinating, urgency and vaginal pain.       Issues with going to bathroom started today, feels like she still has to urinate after voiding    Neurological:  Positive for dizziness, light-headedness and headaches.     Objective    BP 108/72 (BP Location: Right Arm, Patient Position: Sitting, Cuff Size: Large)   Pulse 93   Temp 98.5 F (36.9 C) (Oral)   Resp 15   Ht 4\' 11"  (1.499 m)   Wt 133 lb 4.8 oz (60.5 kg)   LMP 01/24/2022   SpO2 100%   BMI 26.92 kg/m   Physical Exam Vitals and nursing note reviewed.  Constitutional:      General: She is not in acute distress.    Appearance: Normal appearance. She is overweight. She is not ill-appearing, toxic-appearing or diaphoretic.  HENT:     Head: Normocephalic and atraumatic.  Cardiovascular:     Rate and Rhythm: Normal rate and regular rhythm.     Pulses: Normal pulses.     Heart sounds: Normal heart sounds. No murmur heard.    No friction rub. No gallop.  Pulmonary:     Effort: Pulmonary effort is normal. No respiratory distress.     Breath sounds: Normal breath sounds. No stridor. No wheezing, rhonchi or rales.  Chest:     Chest wall: No tenderness.  Abdominal:     General: Bowel sounds are normal.     Palpations: Abdomen is soft.     Tenderness: There is right CVA tenderness. There is no left CVA tenderness.  Musculoskeletal:  General: No swelling, tenderness, deformity or signs of injury. Normal range of motion.     Right lower leg: No edema.     Left lower leg: No edema.  Skin:    General: Skin is warm and dry.     Capillary Refill: Capillary refill takes less than 2 seconds.     Coloration: Skin is not jaundiced or pale.     Findings: No bruising, erythema, lesion or rash.  Neurological:     General: No focal deficit present.     Mental Status: She is alert and oriented to person, place, and time. Mental status is at baseline.     Cranial Nerves: No cranial nerve  deficit.     Sensory: No sensory deficit.     Motor: No weakness.     Coordination: Coordination normal.  Psychiatric:        Mood and Affect: Mood normal.        Behavior: Behavior normal.        Thought Content: Thought content normal.        Judgment: Judgment normal.     Results for orders placed or performed in visit on 03/28/22  POCT Urinalysis Dipstick  Result Value Ref Range   Color, UA Negative    Clarity, UA Negative    Glucose, UA Negative Negative   Bilirubin, UA Negative    Ketones, UA Negative    Spec Grav, UA 1.010 1.010 - 1.025   Blood, UA Negative    pH, UA 6.5 5.0 - 8.0   Protein, UA Positive (A) Negative   Urobilinogen, UA 0.2 0.2 or 1.0 E.U./dL   Nitrite, UA negative    Leukocytes, UA Negative Negative   Appearance Straw Yellow    Odor      Assessment & Plan     Problem List Items Addressed This Visit       Other   Adjustment disorder with anxious mood    Acute on chronic, patient remains on edge thinking that urinary symptoms are in setting of recent miscarriage Recommend Ucx and plan to start ABX if approved by eye team at [facility] 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     Vonna Kotyk, FNP, have reviewed all documentation for this visit. The documentation on 03/28/22 for the exam, diagnosis, procedures, and orders are all accurate and  complete.  Gwyneth Sprout, [place] [phone] (phone) [phone] (fax)  [place]

## 2022-03-28 NOTE — Assessment & Plan Note (Signed)
Acute, x 7 days; worsening in past 4 days Some relief with AZO cranberry x 3 days POC urine with protein; specific gravity altered today +CVA tenderness

## 2022-03-28 NOTE — Assessment & Plan Note (Signed)
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

## 2022-03-28 NOTE — Assessment & Plan Note (Signed)
Acute, in setting of UTI like s/s Will send for urine cx as well as urine cytology

## 2022-03-30 LAB — URINE CULTURE: Organism ID, Bacteria: NO GROWTH

## 2022-03-30 LAB — SPECIMEN STATUS REPORT

## 2022-03-30 NOTE — Progress Notes (Signed)
No growth seen on urinary sample; this is reassuring for negative UTI. Continue to emphasize water given protein seen on in office sample. Good luck with your eye procedure tomorrow.

## 2022-03-31 LAB — URINE CYTOLOGY ANCILLARY ONLY
Candida Urine: NEGATIVE
Chlamydia: NEGATIVE
Comment: NEGATIVE
Comment: NEGATIVE
Comment: NORMAL
Neisseria Gonorrhea: NEGATIVE
Trichomonas: NEGATIVE

## 2022-04-14 ENCOUNTER — Telehealth: Payer: Self-pay

## 2022-04-14 NOTE — Telephone Encounter (Signed)
Copied from CRM [id number]. Topic: General - Inquiry >> Apr 14, 2022 12:32 PM Clide Dales wrote: Patient would like to be sent for a follow up ultrasound of her right breast. Last ultrasound was done 11/01/2018 for a benign mass. Please follow up with patient if order can be placed.

## 2022-04-18 NOTE — Telephone Encounter (Signed)
Advised, patient will call back to make appointment

## 2022-07-19 ENCOUNTER — Encounter: Payer: Self-pay | Admitting: Internal Medicine

## 2022-07-19 ENCOUNTER — Other Ambulatory Visit: Payer: Self-pay | Admitting: Internal Medicine

## 2022-07-19 ENCOUNTER — Ambulatory Visit: Payer: Medicaid Other | Admitting: Internal Medicine

## 2022-07-19 VITALS — BP 108/62 | HR 89 | Temp 96.8°F | Ht 59.0 in | Wt 136.0 lb

## 2022-07-19 DIAGNOSIS — D241 Benign neoplasm of right breast: Secondary | ICD-10-CM | POA: Diagnosis not present

## 2022-07-19 DIAGNOSIS — F32A Depression, unspecified: Secondary | ICD-10-CM

## 2022-07-19 DIAGNOSIS — E7800 Pure hypercholesterolemia, unspecified: Secondary | ICD-10-CM | POA: Diagnosis not present

## 2022-07-19 DIAGNOSIS — M67431 Ganglion, right wrist: Secondary | ICD-10-CM

## 2022-07-19 DIAGNOSIS — F419 Anxiety disorder, unspecified: Secondary | ICD-10-CM | POA: Diagnosis not present

## 2022-07-19 DIAGNOSIS — Z1231 Encounter for screening mammogram for malignant neoplasm of breast: Secondary | ICD-10-CM

## 2022-07-19 DIAGNOSIS — E663 Overweight: Secondary | ICD-10-CM

## 2022-07-19 DIAGNOSIS — N63 Unspecified lump in unspecified breast: Secondary | ICD-10-CM

## 2022-07-19 DIAGNOSIS — Z6827 Body mass index (BMI) 27.0-27.9, adult: Secondary | ICD-10-CM

## 2022-07-19 NOTE — Assessment & Plan Note (Signed)
Encourage diet and exercise for weight loss 

## 2022-07-19 NOTE — Assessment & Plan Note (Signed)
Will check lipid panel annual exam Encourage low-fat diet

## 2022-07-19 NOTE — Patient Instructions (Signed)

## 2022-07-19 NOTE — Assessment & Plan Note (Signed)
She is not sure if she is going to start sertraline Support offered

## 2022-07-19 NOTE — Progress Notes (Signed)
HPI  Patient presents to clinic today to establish care and for management of the conditions listed below.  Anxiety and depression: Chronic, recently prescribed sertraline but she has not started taking this.  She is not currently seeing a therapist or psychiatrist.  She denies SI/HI.  HLD: Her last LDL was 121, triglycerides 109, 03/2022.  She is not taking any cholesterol-lowering medication at this time.  She tries to consume low-fat diet.  She also reports a mass of her right wrist.  She noticed this after cat scratch about 1 year ago.  The area is not tender or painful.  It is never drained anything.  She has not tried anything OTC for this.  She also reports that she had a miscarriage 4 months ago.  She is planning on trying to conceive again.  She is taking prenatal vitamins.  She has a GYN.  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  Her last ultrasound was 10/2018.  She was supposed to go back in 6 months or 1 year for follow-up of this.  She did not because of COVID.  She would like to repeat imaging at this time.  No past medical history on file.  Current Outpatient Medications  Medication Sig Dispense Refill   nitrofurantoin, macrocrystal-monohydrate, (MACROBID) 100 MG capsule Take 1 capsule (100 mg total) by mouth 2 (two) times daily. 10 capsule 0   No current facility-administered medications for this visit.    No Known Allergies  No family history on file.  Social History   Socioeconomic History   Marital status: Single    Spouse name: Not on file   Number of children: Not on file   Years of education: Not on file   Highest education level: Not on file  Occupational History   Not on file  Tobacco Use   Smoking status: Former    Types: Cigarettes    Start date: 08/30/2018   Smokeless tobacco: Never  Vaping Use   Vaping status: Never Used  Substance and Sexual Activity   Alcohol use: Yes    Comment: 1 every 3 month   Drug use: Yes    Types:  Ketamine, Marijuana   Sexual activity: Yes  Other Topics Concern   Not on file  Social History Narrative   Not on file   Social Determinants of Health   Financial Resource Strain: Patient Declined (03/28/2022)   Overall Financial Resource Strain (CARDIA)    Difficulty of Paying Living Expenses: Patient declined  Food Insecurity: Patient Declined (03/28/2022)   Hunger Vital Sign    Worried About Running Out of Food in the Last Year: Patient declined    Ran Out of Food in the Last Year: Patient declined  Transportation Needs: No Transportation Needs (03/28/2022)   PRAPARE - Administrator, Civil Service (Medical): No    Lack of Transportation (Non-Medical): No  Physical Activity: Insufficiently Active (03/28/2022)   Exercise Vital Sign    Days of Exercise per Week: 3 days    Minutes of Exercise per Session: 30 min  Stress: No Stress Concern Present (03/28/2022)   [employer] of Occupational Health - Occupational Stress Questionnaire    Feeling of Stress : Only a little  Social Connections: Unknown (03/28/2022)   Social Connection and Isolation Panel [NHANES]    Frequency of Communication with Friends and Family: More than three times a week    Frequency of Social Gatherings with Friends and Family: Patient declined  Attends Religious Services: Patient declined    Active Member of Clubs or Organizations: Patient declined    Attends Banker Meetings: Not on file    Marital Status: Patient declined  Intimate Partner Violence: Not on file    ROS:  Constitutional: Denies fever, malaise, fatigue, headache or abrupt weight changes.  HEENT: Denies eye pain, eye redness, ear pain, ringing in the ears, wax buildup, runny nose, nasal congestion, bloody nose, or sore throat. Respiratory: Denies difficulty breathing, shortness of breath, cough or sputum production.   Cardiovascular: Denies chest pain, chest tightness, palpitations or swelling in the hands or  feet.  Gastrointestinal: Denies abdominal pain, bloating, constipation, diarrhea or blood in the stool.  GU: Denies frequency, urgency, pain with urination, blood in urine, odor or discharge. Musculoskeletal: Patient reports mass of right wrist.  Denies decrease in range of motion, difficulty with gait, muscle pain or joint pain and swelling.  Skin: Patient reports mass of right axilla.  Denies redness, rashes, lesions or ulcercations.  Neurological: Denies dizziness, difficulty with memory, difficulty with speech or problems with balance and coordination.  Psych: Patient has a history of anxiety and depression.  Denies SI/HI.  No other specific complaints in a complete review of systems (except as listed in HPI above).  PE:  BP 108/62 (BP Location: Right Arm, Patient Position: Sitting, Cuff Size: Normal)   Pulse 89   Temp (!) 96.8 F (36 C) (Temporal)   Ht 4\' 11"  (1.499 m)   Wt 136 lb (61.7 kg)   SpO2 98%   BMI 27.47 kg/m   Wt Readings from Last 3 Encounters:  03/28/22 133 lb 4.8 oz (60.5 kg)  03/09/22 131 lb (59.4 kg)  03/04/21 128 lb 14.4 oz (58.5 kg)    General: Appears her stated age, overweight, in NAD. Skin: Ganglion cyst noted of right wrist. HEENT: Head: normal shape and size; Eyes: sclera white, no icterus, conjunctiva pink, PERRLA and EOMs intact;  Cardiovascular: Normal rate and rhythm. S1,S2 noted.  No murmur, rubs or gallops noted.  Pulmonary/Chest: Normal effort and positive vesicular breath sounds. No respiratory distress. No wheezes, rales or ronchi noted.  Musculoskeletal: Normal flexion, extension and rotation of the right wrist.  No difficulty with gait.  Neurological: Alert and oriented. Coordination normal.  Psychiatric: Mood and affect normal. Behavior is normal. Judgment and thought content normal.   BMET    Component Value Date/Time   NA 143 03/09/2022 1322   K 4.1 03/09/2022 1322   CL 106 03/09/2022 1322   CO2 21 03/09/2022 1322   GLUCOSE 109 (H)  03/09/2022 1322   GLUCOSE 99 08/24/2018 1800   BUN 7 03/09/2022 1322   CREATININE 0.59 03/09/2022 1322   CALCIUM 9.4 03/09/2022 1322   GFRNONAA 131 03/29/2019 1452   GFRAA 152 03/29/2019 1452    Lipid Panel     Component Value Date/Time   CHOL 191 03/09/2022 1322   TRIG 109 03/09/2022 1322   HDL 50 03/09/2022 1322   CHOLHDL 3.8 03/09/2022 1322   LDLCALC 121 (H) 03/09/2022 1322    CBC    Component Value Date/Time   WBC 8.5 03/09/2022 1322   WBC 9.7 08/24/2018 1800   RBC 4.97 03/09/2022 1322   RBC 4.84 08/24/2018 1800   HGB 14.8 03/09/2022 1322   HCT 43.1 03/09/2022 1322   PLT 377 03/09/2022 1322   MCV 87 03/09/2022 1322   MCH 29.8 03/09/2022 1322   MCH 29.5 08/24/2018 1800   MCHC 34.3  03/09/2022 1322   MCHC 33.9 08/24/2018 1800   RDW 12.5 03/09/2022 1322   LYMPHSABS 2.1 03/09/2022 1322   EOSABS 0.0 03/09/2022 1322   BASOSABS 0.1 03/09/2022 1322    Hgb A1C Lab Results  Component Value Date   HGBA1C 5.5 03/09/2022     Assessment and Plan:  Ganglion cyst of right wrist:  Not painful, not interfering with her range of motion No intervention needed at this time Can refer to hand surgery in the future if needed for further evaluation and treatment  RTC in 1 year for annual exam Nicki Reaper, NP

## 2022-08-17 ENCOUNTER — Ambulatory Visit
Admission: RE | Admit: 2022-08-17 | Discharge: 2022-08-17 | Disposition: A | Discharge status: Home / Self Care | Payer: Medicaid Other | Source: Ambulatory Visit | Attending: Internal Medicine | Admitting: Internal Medicine

## 2022-08-17 DIAGNOSIS — N63 Unspecified lump in unspecified breast: Secondary | ICD-10-CM | POA: Insufficient documentation

## 2022-08-17 DIAGNOSIS — Z1231 Encounter for screening mammogram for malignant neoplasm of breast: Secondary | ICD-10-CM | POA: Insufficient documentation

## 2022-08-17 DIAGNOSIS — D241 Benign neoplasm of right breast: Secondary | ICD-10-CM | POA: Insufficient documentation

## 2022-10-04 ENCOUNTER — Ambulatory Visit: Payer: Medicaid Other | Admitting: Internal Medicine

## 2023-07-21 ENCOUNTER — Encounter: Payer: Self-pay | Admitting: Internal Medicine

## 2023-07-21 NOTE — Progress Notes (Deleted)
 HPI  Patient presents to clinic today for her annual exam.  Flu: Tetanus: 05/2011 Covid: Pap smear: 03/2021 Dentist:  Diet: Exercise:  No past medical history on file.  Current Outpatient Medications  Medication Sig Dispense Refill   sertraline (ZOLOFT) 50 MG tablet Take 1/2 tablet daily x 1 week then increase to 1 tablet daily     No current facility-administered medications for this visit.    Allergies  Allergen Reactions   Kiwi Extract Itching    Itchy mouth and throat    Family History  Adopted: Yes    Social History   Socioeconomic History   Marital status: Single    Spouse name: Not on file   Number of children: Not on file   Years of education: Not on file   Highest education level: Not on file  Occupational History   Not on file  Tobacco Use   Smoking status: Every Day    Types: Cigarettes    Start date: 08/30/2018   Smokeless tobacco: Never  Vaping Use   Vaping status: Never Used  Substance and Sexual Activity   Alcohol use: Yes    Comment: 1 every 3 month   Drug use: Yes    Types: Ketamine, Marijuana   Sexual activity: Yes  Other Topics Concern   Not on file  Social History Narrative   Not on file   Social Drivers of Health   Financial Resource Strain: Patient Declined (03/28/2022)   Overall Financial Resource Strain (CARDIA)    Difficulty of Paying Living Expenses: Patient declined  Food Insecurity: Patient Declined (03/28/2022)   Hunger Vital Sign    Worried About Running Out of Food in the Last Year: Patient declined    Ran Out of Food in the Last Year: Patient declined  Transportation Needs: No Transportation Needs (03/28/2022)   PRAPARE - Administrator, Civil Service (Medical): No    Lack of Transportation (Non-Medical): No  Physical Activity: Insufficiently Active (03/28/2022)   Exercise Vital Sign    Days of Exercise per Week: 3 days    Minutes of Exercise per Session: 30 min  Stress: No Stress Concern Present  (03/28/2022)   [employer] of Occupational Health - Occupational Stress Questionnaire    Feeling of Stress : Only a little  Social Connections: Unknown (03/28/2022)   Social Connection and Isolation Panel    Frequency of Communication with Friends and Family: More than three times a week    Frequency of Social Gatherings with Friends and Family: Patient declined    Attends Religious Services: Patient declined    Database administrator or Organizations: Patient declined    Attends Engineer, structural: Not on file    Marital Status: Patient declined  Intimate Partner Violence: Not on file    ROS:  Constitutional: Denies fever, malaise, fatigue, headache or abrupt weight changes.  HEENT: Denies eye pain, eye redness, ear pain, ringing in the ears, wax buildup, runny nose, nasal congestion, bloody nose, or sore throat. Respiratory: Denies difficulty breathing, shortness of breath, cough or sputum production.   Cardiovascular: Denies chest pain, chest tightness, palpitations or swelling in the hands or feet.  Gastrointestinal: Denies abdominal pain, bloating, constipation, diarrhea or blood in the stool.  GU: Denies frequency, urgency, pain with urination, blood in urine, odor or discharge. Musculoskeletal: Patient reports mass of right wrist.  Denies decrease in range of motion, difficulty with gait, muscle pain or joint pain and swelling.  Skin: Patient  reports mass of right axilla.  Denies redness, rashes, lesions or ulcercations.  Neurological: Denies dizziness, difficulty with memory, difficulty with speech or problems with balance and coordination.  Psych: Patient has a history of anxiety and depression.  Denies SI/HI.  No other specific complaints in a complete review of systems (except as listed in HPI above).  PE:  There were no vitals taken for this visit.  Wt Readings from Last 3 Encounters:  07/19/22 136 lb (61.7 kg)  03/28/22 133 lb 4.8 oz (60.5 kg)  03/09/22  131 lb (59.4 kg)    General: Appears her stated age, overweight, in NAD. Skin: Ganglion cyst noted of right wrist. HEENT: Head: normal shape and size; Eyes: sclera white, no icterus, conjunctiva pink, PERRLA and EOMs intact;  Cardiovascular: Normal rate and rhythm. S1,S2 noted.  No murmur, rubs or gallops noted.  Pulmonary/Chest: Normal effort and positive vesicular breath sounds. No respiratory distress. No wheezes, rales or ronchi noted.  Musculoskeletal: Normal flexion, extension and rotation of the right wrist.  No difficulty with gait.  Neurological: Alert and oriented. Coordination normal.  Psychiatric: Mood and affect normal. Behavior is normal. Judgment and thought content normal.   BMET    Component Value Date/Time   NA 143 03/09/2022 1322   K 4.1 03/09/2022 1322   CL 106 03/09/2022 1322   CO2 21 03/09/2022 1322   GLUCOSE 109 (H) 03/09/2022 1322   GLUCOSE 99 08/24/2018 1800   BUN 7 03/09/2022 1322   CREATININE 0.59 03/09/2022 1322   CALCIUM 9.4 03/09/2022 1322   GFRNONAA 131 03/29/2019 1452   GFRAA 152 03/29/2019 1452    Lipid Panel     Component Value Date/Time   CHOL 191 03/09/2022 1322   TRIG 109 03/09/2022 1322   HDL 50 03/09/2022 1322   CHOLHDL 3.8 03/09/2022 1322   LDLCALC 121 (H) 03/09/2022 1322    CBC    Component Value Date/Time   WBC 8.5 03/09/2022 1322   WBC 9.7 08/24/2018 1800   RBC 4.97 03/09/2022 1322   RBC 4.84 08/24/2018 1800   HGB 14.8 03/09/2022 1322   HCT 43.1 03/09/2022 1322   PLT 377 03/09/2022 1322   MCV 87 03/09/2022 1322   MCH 29.8 03/09/2022 1322   MCH 29.5 08/24/2018 1800   MCHC 34.3 03/09/2022 1322   MCHC 33.9 08/24/2018 1800   RDW 12.5 03/09/2022 1322   LYMPHSABS 2.1 03/09/2022 1322   EOSABS 0.0 03/09/2022 1322   BASOSABS 0.1 03/09/2022 1322    Hgb A1C Lab Results  Component Value Date   HGBA1C 5.5 03/09/2022     Assessment and Plan:  Preventative Health Maintenance:  Encouraged her to get a flu shot in the  fall Tetanus Encouraged her to get her covid vaccine Pap smear UTD Encouraged her to consume a balanced diet and exercise regimen Advised her to see a dentist annually Will check CBC< CMET, Lipid, A1C and Hep C today   RTC in 6 months, follow up chronic conditions Angeline Laura, NP

## 2023-07-24 ENCOUNTER — Ambulatory Visit: Admitting: Internal Medicine

## 2023-07-24 ENCOUNTER — Encounter: Payer: Self-pay | Admitting: Internal Medicine

## 2023-07-24 ENCOUNTER — Ambulatory Visit: Payer: Self-pay

## 2023-07-24 VITALS — BP 110/70 | Ht 59.0 in | Wt 119.0 lb

## 2023-07-24 DIAGNOSIS — R350 Frequency of micturition: Secondary | ICD-10-CM

## 2023-07-24 DIAGNOSIS — R3989 Other symptoms and signs involving the genitourinary system: Secondary | ICD-10-CM

## 2023-07-24 DIAGNOSIS — R3915 Urgency of urination: Secondary | ICD-10-CM

## 2023-07-24 DIAGNOSIS — R310 Gross hematuria: Secondary | ICD-10-CM

## 2023-07-24 DIAGNOSIS — R300 Dysuria: Secondary | ICD-10-CM | POA: Diagnosis not present

## 2023-07-24 LAB — POCT URINE DIPSTICK
Bilirubin, UA: NEGATIVE
Glucose, UA: NEGATIVE mg/dL
Ketones, POC UA: NEGATIVE mg/dL
Leukocytes, UA: NEGATIVE
Nitrite, UA: NEGATIVE
POC PROTEIN,UA: NEGATIVE
Spec Grav, UA: 1.01 (ref 1.010–1.025)
Urobilinogen, UA: 0.2 U/dL
pH, UA: 6 (ref 5.0–8.0)

## 2023-07-24 NOTE — Telephone Encounter (Signed)
 FYI Only or Action Required?: FYI only for provider.  Patient was last seen in primary care on 07/19/2022 by Antonette Angeline ORN, NP.  Called Nurse Triage reporting Dysuria.  Symptoms began several days ago.  Interventions attempted: OTC medications: azo and crandberry and Rest, hydration, or home remedies.  Symptoms are: gradually worsening.  Triage Disposition: See HCP Within 4 Hours (Or PCP Triage)  Patient/caregiver understands and will follow disposition?: Yes    Copied from CRM [id number]. Topic: Clinical - Red Word Triage >> Jul 24, 2023  9:26 AM Gustabo D wrote: Patient thinks she has a UTI for the past week she's been having pain and it's been uncomfortable. She started peeing blood yesterday and stopped this morning. She says there is discoloring in her pee not sure if it could be a little blood. Still burns when she pee. Reason for Disposition  [1] SEVERE pain with urination (e.g., excruciating) AND [2] not improved after 2 hours of pain medicine  Answer Assessment - Initial Assessment Questions 1. SEVERITY: How bad is the pain?  (e.g., Scale 1-10; mild, moderate, or severe)     8/10  3. PATTERN: Is pain present every time you urinate or just sometimes?      Every time 4. ONSET: When did the painful urination start?      4-5 days 5. FEVER: Do you have a fever? If Yes, ask: What is your temperature, how was it measured, and when did it start?     no 6. PAST UTI: Have you had a urine infection before? If Yes, ask: When was the last time? and What happened that time?      no 7. CAUSE: What do you think is causing the painful urination?  (e.g., UTI, scratch, Herpes sore)     uti 8. OTHER SYMPTOMS: Do you have any other symptoms? (e.g., blood in urine, flank pain, genital sores, urgency, vaginal discharge)     Blood in urine, abd pain  Protocols used: Urination Pain - Female-A-AH

## 2023-07-24 NOTE — Telephone Encounter (Signed)
 Will discuss at upcoming appointment

## 2023-07-24 NOTE — Patient Instructions (Signed)
 Trouble Peeing (Acute Urinary Retention) in Females: What to Know  Acute urinary retention is when a person can't pee at all or can only pee a little. This can come on all of a sudden. If it's not treated, it can lead to kidney problems or other serious problems. What are the causes? Acute urinary retention may be caused by: A problem with the urethra. This is the tube that drains pee from the bladder. Problems with the nerves in the bladder. The organs in the area between your hip bones (pelvis) shifting out of place. Tumors. The birth of a baby through the vagina. An infection. Having trouble pooping (constipation). Some medicines. What increases the risk? Females over age 16 are more at risk. Other health problems can also raise the risk. These include: Multiple sclerosis. Injury to the spinal cord. Diabetes. A condition that affects the way the brain works, such as dementia. Mental health problems. Having urinary retention before. Having had surgery in your pelvis area. What are the signs or symptoms? Trouble peeing. Pain in the lower belly. How is this treated? Treatment may include: Medicines. Treatment for conditions that may cause this. Placing a soft tube called a catheter into the bladder to drain pee out of the body. Therapy to treat mental health problems. If needed, you may be treated in the hospital for kidney problems. Follow these instructions at home: Medicines Take medicines only as told. Do not take any medicine unless your doctor says it's OK. If you were given antibiotics, take them as told. Do not stop taking them even if you start to feel better. General instructions Do not smoke, vape, or use nicotine or tobacco. Drink more fluids as told. If you need to use a catheter, follow the instructions closely. This will help prevent a bladder infection. If told, keep track of changes in your blood pressure at home. Tell your doctor about them. Contact a doctor  if: You have bladder cramping, called spasms. You leak pee when you have spasms. You have a fever or chills. You have blood in your pee. Get help right away if: You can't pee. This information is not intended to replace advice given to you by your health care provider. Make sure you discuss any questions you have with your health care provider. Document Revised: 08/18/2022 Document Reviewed: 08/18/2022 Elsevier Patient Education  2024 [employer].

## 2023-07-24 NOTE — Progress Notes (Signed)
 Subjective:    Patient ID: Stacy Zuniga, female    DOB: 05/07/1990, 33 y.o.   MRN: [id number]  HPI  Discussed the use of AI scribe software for clinical note transcription with the patient, who gave verbal consent to proceed.  Stacy Zuniga is a 33 year old female who presents with burning, pain, and blood in urine.  She has been experiencing burning and pain during urination, along with hematuria, which started yesterday and has persisted for about a week. She recalls having similar symptoms last year, but she did not progress to a urinary tract infection.  She experiences lower abdominal pain and is uncertain if her back pain is related to her recent exercise routine. No fever, chills, or vomiting, but she does report nausea. She has not taken any over-the-counter medications for these symptoms.  Her menstrual periods are regular, with the last period occurring earlier this month. She confirms that the hematuria is not related to her menstrual cycle.  She experiences increased urinary frequency and urgency.  Her social history includes smoking cigarettes and consuming green tea, which she drinks along with water and flavored beverages. She mentions that these urinary issues began after a miscarriage last year, occurring at least twice since then.       Review of Systems   No past medical history on file.  Current Outpatient Medications  Medication Sig Dispense Refill   sertraline (ZOLOFT) 50 MG tablet Take 1/2 tablet daily x 1 week then increase to 1 tablet daily     No current facility-administered medications for this visit.    Allergies  Allergen Reactions   Kiwi Extract Itching    Itchy mouth and throat    Family History  Adopted: Yes    Social History   Socioeconomic History   Marital status: Single    Spouse name: Not on file   Number of children: Not on file   Years of education: Not on file   Highest education level: Not on file  Occupational History    Not on file  Tobacco Use   Smoking status: Every Day    Types: Cigarettes    Start date: 08/30/2018   Smokeless tobacco: Never  Vaping Use   Vaping status: Never Used  Substance and Sexual Activity   Alcohol use: Yes    Comment: 1 every 3 month   Drug use: Yes    Types: Ketamine, Marijuana   Sexual activity: Yes  Other Topics Concern   Not on file  Social History Narrative   Not on file   Social Drivers of Health   Financial Resource Strain: Patient Declined (03/28/2022)   Overall Financial Resource Strain (CARDIA)    Difficulty of Paying Living Expenses: Patient declined  Food Insecurity: Patient Declined (03/28/2022)   Hunger Vital Sign    Worried About Running Out of Food in the Last Year: Patient declined    Ran Out of Food in the Last Year: Patient declined  Transportation Needs: No Transportation Needs (03/28/2022)   PRAPARE - Administrator, Civil Service (Medical): No    Lack of Transportation (Non-Medical): No  Physical Activity: Insufficiently Active (03/28/2022)   Exercise Vital Sign    Days of Exercise per Week: 3 days    Minutes of Exercise per Session: 30 min  Stress: No Stress Concern Present (03/28/2022)   [employer] of Occupational Health - Occupational Stress Questionnaire    Feeling of Stress : Only a little  Social Connections: Unknown (  03/28/2022)   Social Connection and Isolation Panel    Frequency of Communication with Friends and Family: More than three times a week    Frequency of Social Gatherings with Friends and Family: Patient declined    Attends Religious Services: Patient declined    Database administrator or Organizations: Patient declined    Attends Engineer, structural: Not on file    Marital Status: Patient declined  Intimate Partner Violence: Not on file     Constitutional: Denies fever, malaise, fatigue, headache or abrupt weight changes.  Respiratory: Denies difficulty breathing, shortness of breath,  cough or sputum production.   Cardiovascular: Denies chest pain, chest tightness, palpitations or swelling in the hands or feet.  Gastrointestinal: Pt reports nausea, lower abdominal pain. Denies bloating, constipation, diarrhea or blood in the stool.  GU: Patient reports urgency, frequency, dysuria, burning with urination and blood in urine.  Denies odor or discharge. Neurological: Denies dizziness, difficulty with memory, difficulty with speech or problems with balance and coordination.    No other specific complaints in a complete review of systems (except as listed in HPI above).      Objective:   Physical Exam  BP 110/70 (BP Location: Right Arm, Patient Position: Sitting, Cuff Size: Normal)   Ht 4' 11 (1.499 m)   Wt 119 lb (54 kg)   LMP 07/06/2023 (Approximate)   BMI 24.04 kg/m   Wt Readings from Last 3 Encounters:  07/19/22 136 lb (61.7 kg)  03/28/22 133 lb 4.8 oz (60.5 kg)  03/09/22 131 lb (59.4 kg)    General: Appears her stated age, well developed, well nourished in NAD. Cardiovascular: Normal rate. Pulmonary/Chest: Normal effort. Abdomen: Tender over the bladder. No CVA tenderness noted.  Musculoskeletal: No difficulty with gait.  Neurological: Alert and oriented.    BMET    Component Value Date/Time   NA 143 03/09/2022 1322   K 4.1 03/09/2022 1322   CL 106 03/09/2022 1322   CO2 21 03/09/2022 1322   GLUCOSE 109 (H) 03/09/2022 1322   GLUCOSE 99 08/24/2018 1800   BUN 7 03/09/2022 1322   CREATININE 0.59 03/09/2022 1322   CALCIUM 9.4 03/09/2022 1322   GFRNONAA 131 03/29/2019 1452   GFRAA 152 03/29/2019 1452    Lipid Panel     Component Value Date/Time   CHOL 191 03/09/2022 1322   TRIG 109 03/09/2022 1322   HDL 50 03/09/2022 1322   CHOLHDL 3.8 03/09/2022 1322   LDLCALC 121 (H) 03/09/2022 1322    CBC    Component Value Date/Time   WBC 8.5 03/09/2022 1322   WBC 9.7 08/24/2018 1800   RBC 4.97 03/09/2022 1322   RBC 4.84 08/24/2018 1800   HGB 14.8  03/09/2022 1322   HCT 43.1 03/09/2022 1322   PLT 377 03/09/2022 1322   MCV 87 03/09/2022 1322   MCH 29.8 03/09/2022 1322   MCH 29.5 08/24/2018 1800   MCHC 34.3 03/09/2022 1322   MCHC 33.9 08/24/2018 1800   RDW 12.5 03/09/2022 1322   LYMPHSABS 2.1 03/09/2022 1322   EOSABS 0.0 03/09/2022 1322   BASOSABS 0.1 03/09/2022 1322    Hgb A1C Lab Results  Component Value Date   HGBA1C 5.5 03/09/2022            Assessment & Plan:   Assessment and Plan    Urgency, Frequency, Bladder Pressure, Hematuria and Dysuria Differential includes interstitial cystitis due to caffeine and nicotine use. Advised reduction of caffeine and nicotine. -urinalysis normal -  Order urine culture. - Advise reduction of caffeine and nicotine intake. - Encourage increased fluid intake. - Consider referral to urologist if symptoms persist and urine culture is negative.    Schedule an appointment for your annual exam Angeline Laura, NP

## 2023-07-25 LAB — URINE CULTURE
MICRO NUMBER:: 16725099
Result:: NO GROWTH
SPECIMEN QUALITY:: ADEQUATE

## 2023-07-26 ENCOUNTER — Ambulatory Visit: Payer: Self-pay | Admitting: Internal Medicine
